# Patient Record
Sex: Female | Born: 1950 | Race: White | Hispanic: No | Marital: Married
Health system: Southern US, Community
[De-identification: ages and names within clinical notes are randomized; demographics above are authoritative.]

## PROBLEM LIST (undated history)

## (undated) DIAGNOSIS — Z9911 Dependence on respirator [ventilator] status: Secondary | ICD-10-CM

## (undated) DIAGNOSIS — R109 Unspecified abdominal pain: Secondary | ICD-10-CM

## (undated) DIAGNOSIS — I469 Cardiac arrest, cause unspecified: Secondary | ICD-10-CM

## (undated) DIAGNOSIS — J449 Chronic obstructive pulmonary disease, unspecified: Secondary | ICD-10-CM

## (undated) DIAGNOSIS — J9621 Acute and chronic respiratory failure with hypoxia: Secondary | ICD-10-CM

## (undated) DIAGNOSIS — J4489 Other specified chronic obstructive pulmonary disease: Secondary | ICD-10-CM

## (undated) DIAGNOSIS — Z978 Presence of other specified devices: Secondary | ICD-10-CM

---

## 2018-12-30 ENCOUNTER — Inpatient Hospital Stay
Admission: RE | Admit: 2018-12-30 | Discharge: 2019-01-27 | Disposition: A | Discharge status: Skilled Nursing Facility | Payer: Medicare Other | Source: Other Acute Inpatient Hospital | Attending: Internal Medicine | Admitting: Internal Medicine

## 2018-12-30 ENCOUNTER — Other Ambulatory Visit (HOSPITAL_COMMUNITY): Payer: Medicare Other

## 2018-12-30 DIAGNOSIS — Z4659 Encounter for fitting and adjustment of other gastrointestinal appliance and device: Secondary | ICD-10-CM

## 2018-12-30 DIAGNOSIS — J9621 Acute and chronic respiratory failure with hypoxia: Secondary | ICD-10-CM | POA: Diagnosis present

## 2018-12-30 DIAGNOSIS — J189 Pneumonia, unspecified organism: Secondary | ICD-10-CM

## 2018-12-30 DIAGNOSIS — Z9289 Personal history of other medical treatment: Secondary | ICD-10-CM

## 2018-12-30 DIAGNOSIS — K838 Other specified diseases of biliary tract: Secondary | ICD-10-CM

## 2018-12-30 DIAGNOSIS — K567 Ileus, unspecified: Secondary | ICD-10-CM

## 2018-12-30 DIAGNOSIS — J4489 Other specified chronic obstructive pulmonary disease: Secondary | ICD-10-CM | POA: Diagnosis present

## 2018-12-30 DIAGNOSIS — R000 Tachycardia, unspecified: Secondary | ICD-10-CM

## 2018-12-30 DIAGNOSIS — Z9911 Dependence on respirator [ventilator] status: Secondary | ICD-10-CM

## 2018-12-30 DIAGNOSIS — Z978 Presence of other specified devices: Secondary | ICD-10-CM | POA: Diagnosis present

## 2018-12-30 DIAGNOSIS — R109 Unspecified abdominal pain: Secondary | ICD-10-CM | POA: Diagnosis present

## 2018-12-30 DIAGNOSIS — J449 Chronic obstructive pulmonary disease, unspecified: Secondary | ICD-10-CM | POA: Diagnosis present

## 2018-12-30 DIAGNOSIS — J9690 Respiratory failure, unspecified, unspecified whether with hypoxia or hypercapnia: Secondary | ICD-10-CM

## 2018-12-30 DIAGNOSIS — I469 Cardiac arrest, cause unspecified: Secondary | ICD-10-CM | POA: Diagnosis present

## 2018-12-30 DIAGNOSIS — R140 Abdominal distension (gaseous): Secondary | ICD-10-CM

## 2018-12-30 HISTORY — DX: Dependence on respirator (ventilator) status: Z99.11

## 2018-12-30 HISTORY — DX: Cardiac arrest, cause unspecified: I46.9

## 2018-12-30 HISTORY — DX: Presence of other specified devices: Z97.8

## 2018-12-30 HISTORY — DX: Other specified chronic obstructive pulmonary disease: J44.89

## 2018-12-30 HISTORY — DX: Unspecified abdominal pain: R10.9

## 2018-12-30 HISTORY — DX: Chronic obstructive pulmonary disease, unspecified: J44.9

## 2018-12-30 HISTORY — DX: Acute and chronic respiratory failure with hypoxia: J96.21

## 2018-12-30 MED ORDER — INSULIN GLARGINE 100 UNIT/ML ~~LOC~~ SOLN
1.00 | SUBCUTANEOUS | Status: DC
Start: ? — End: 2018-12-30

## 2018-12-30 MED ORDER — INSULIN LISPRO 100 UNIT/ML ~~LOC~~ SOLN
1.00 | SUBCUTANEOUS | Status: DC
Start: ? — End: 2018-12-30

## 2018-12-30 MED ORDER — METHYLPREDNISOLONE SODIUM SUCC 40 MG IJ SOLR
40.00 | INTRAMUSCULAR | Status: DC
Start: 2018-12-31 — End: 2018-12-30

## 2018-12-30 MED ORDER — GENERIC EXTERNAL MEDICATION
0.08 | Status: DC
Start: ? — End: 2018-12-30

## 2018-12-30 MED ORDER — GENERIC EXTERNAL MEDICATION
0.04 | Status: DC
Start: ? — End: 2018-12-30

## 2018-12-30 MED ORDER — GENERIC EXTERNAL MEDICATION
Status: DC
Start: ? — End: 2018-12-30

## 2018-12-30 MED ORDER — NITROGLYCERIN 0.4 MG SL SUBL
0.40 | SUBLINGUAL_TABLET | SUBLINGUAL | Status: DC
Start: ? — End: 2018-12-30

## 2018-12-30 MED ORDER — PROPOFOL 200 MG/20ML IV EMUL
5.00 | INTRAVENOUS | Status: DC
Start: ? — End: 2018-12-30

## 2018-12-30 MED ORDER — PANCRELIPASE (LIP-PROT-AMYL) 12000-38000 UNITS PO CPEP
24000.00 | ORAL_CAPSULE | ORAL | Status: DC
Start: 2018-12-31 — End: 2018-12-30

## 2018-12-30 MED ORDER — BUDESONIDE 0.5 MG/2ML IN SUSP
0.50 | RESPIRATORY_TRACT | Status: DC
Start: 2018-12-30 — End: 2018-12-30

## 2018-12-30 MED ORDER — FENTANYL CITRATE-NACL 2.5-0.9 MG/250ML-% IV SOLN
50.00 | INTRAVENOUS | Status: DC
Start: ? — End: 2018-12-30

## 2018-12-30 MED ORDER — MUPIROCIN 2 % EX OINT
TOPICAL_OINTMENT | CUTANEOUS | Status: DC
Start: 2018-12-30 — End: 2018-12-30

## 2018-12-30 MED ORDER — INSULIN GLARGINE 100 UNIT/ML ~~LOC~~ SOLN
1.00 | SUBCUTANEOUS | Status: DC
Start: 2018-12-30 — End: 2018-12-30

## 2018-12-30 MED ORDER — ALUM & MAG HYDROXIDE-SIMETH 200-200-20 MG/5ML PO SUSP
30.00 | ORAL | Status: DC
Start: ? — End: 2018-12-30

## 2018-12-30 MED ORDER — IPRATROPIUM-ALBUTEROL 0.5-2.5 (3) MG/3ML IN SOLN
3.00 | RESPIRATORY_TRACT | Status: DC
Start: 2018-12-30 — End: 2018-12-30

## 2018-12-30 MED ORDER — HYDRALAZINE HCL 20 MG/ML IJ SOLN
20.00 | INTRAMUSCULAR | Status: DC
Start: ? — End: 2018-12-30

## 2018-12-30 MED ORDER — VERAPAMIL HCL 80 MG PO TABS
40.00 | ORAL_TABLET | ORAL | Status: DC
Start: 2018-12-30 — End: 2018-12-30

## 2018-12-30 MED ORDER — ACETAMINOPHEN 325 MG PO TABS
650.00 | ORAL_TABLET | ORAL | Status: DC
Start: ? — End: 2018-12-30

## 2018-12-30 MED ORDER — ACETAMINOPHEN 650 MG RE SUPP
650.00 | RECTAL | Status: DC
Start: ? — End: 2018-12-30

## 2018-12-30 MED ORDER — ENOXAPARIN SODIUM 40 MG/0.4ML ~~LOC~~ SOLN
40.00 | SUBCUTANEOUS | Status: DC
Start: 2018-12-30 — End: 2018-12-30

## 2018-12-30 MED ORDER — INSULIN LISPRO 100 UNIT/ML ~~LOC~~ SOLN
1.00 | SUBCUTANEOUS | Status: DC
Start: 2018-12-30 — End: 2018-12-30

## 2018-12-30 MED ORDER — FAMOTIDINE 20 MG PO TABS
20.00 | ORAL_TABLET | ORAL | Status: DC
Start: 2018-12-30 — End: 2018-12-30

## 2018-12-30 MED ORDER — ARFORMOTEROL TARTRATE 15 MCG/2ML IN NEBU
15.00 | INHALATION_SOLUTION | RESPIRATORY_TRACT | Status: DC
Start: 2018-12-30 — End: 2018-12-30

## 2018-12-30 MED ORDER — ACETAMINOPHEN 160 MG/5ML PO SUSP
650.00 | ORAL | Status: DC
Start: ? — End: 2018-12-30

## 2018-12-30 MED ORDER — LABETALOL HCL 5 MG/ML IV SOLN
20.00 | INTRAVENOUS | Status: DC
Start: ? — End: 2018-12-30

## 2018-12-30 MED ORDER — DOCUSATE SODIUM 150 MG/15ML PO LIQD
100.00 | ORAL | Status: DC
Start: 2018-12-30 — End: 2018-12-30

## 2018-12-30 MED ORDER — ALBUTEROL SULFATE (2.5 MG/3ML) 0.083% IN NEBU
2.50 | INHALATION_SOLUTION | RESPIRATORY_TRACT | Status: DC
Start: ? — End: 2018-12-30

## 2018-12-30 MED ORDER — METOPROLOL TARTRATE 50 MG PO TABS
50.00 | ORAL_TABLET | ORAL | Status: DC
Start: 2018-12-30 — End: 2018-12-30

## 2018-12-30 MED ORDER — LORAZEPAM 1 MG PO TABS
1.00 | ORAL_TABLET | ORAL | Status: DC
Start: 2018-12-31 — End: 2018-12-30

## 2018-12-31 ENCOUNTER — Other Ambulatory Visit (HOSPITAL_COMMUNITY): Payer: Medicare Other

## 2018-12-31 ENCOUNTER — Encounter: Payer: Self-pay | Admitting: Internal Medicine

## 2018-12-31 DIAGNOSIS — J4489 Other specified chronic obstructive pulmonary disease: Secondary | ICD-10-CM | POA: Diagnosis present

## 2018-12-31 DIAGNOSIS — J9621 Acute and chronic respiratory failure with hypoxia: Secondary | ICD-10-CM | POA: Diagnosis not present

## 2018-12-31 DIAGNOSIS — Z978 Presence of other specified devices: Secondary | ICD-10-CM | POA: Diagnosis not present

## 2018-12-31 DIAGNOSIS — Z9911 Dependence on respirator [ventilator] status: Secondary | ICD-10-CM

## 2018-12-31 DIAGNOSIS — I469 Cardiac arrest, cause unspecified: Secondary | ICD-10-CM

## 2018-12-31 DIAGNOSIS — R109 Unspecified abdominal pain: Secondary | ICD-10-CM | POA: Diagnosis not present

## 2018-12-31 DIAGNOSIS — J449 Chronic obstructive pulmonary disease, unspecified: Secondary | ICD-10-CM

## 2018-12-31 LAB — URINALYSIS, ROUTINE W REFLEX MICROSCOPIC
Bilirubin Urine: NEGATIVE
Glucose, UA: >=500 mg/dL — AB
Ketones, ur: NEGATIVE mg/dL
Leukocytes,Ua: NEGATIVE
Nitrite: NEGATIVE
Protein, ur: 100 mg/dL — AB
Specific Gravity, Urine: 1.017 (ref 1.005–1.030)
pH: 5 (ref 5.0–8.0)

## 2018-12-31 LAB — BLOOD GAS, ARTERIAL
Acid-Base Excess: 5.8 mmol/L — ABNORMAL HIGH (ref 0.0–2.0)
Bicarbonate: 30 mmol/L — ABNORMAL HIGH (ref 20.0–28.0)
FIO2: 30
MECHVT: 410 mL
O2 Saturation: 89.6 %
PEEP: 5 cmH2O
Patient temperature: 98.6
RATE: 18 resp/min
pCO2 arterial: 45.3 mmHg (ref 32.0–48.0)
pH, Arterial: 7.436 (ref 7.350–7.450)
pO2, Arterial: 63.9 mmHg — ABNORMAL LOW (ref 83.0–108.0)

## 2018-12-31 LAB — CBC WITH DIFFERENTIAL/PLATELET
Abs Immature Granulocytes: 2.49 10*3/uL — ABNORMAL HIGH (ref 0.00–0.07)
Basophils Absolute: 0.2 10*3/uL — ABNORMAL HIGH (ref 0.0–0.1)
Basophils Relative: 1 %
Eosinophils Absolute: 0 10*3/uL (ref 0.0–0.5)
Eosinophils Relative: 0 %
HCT: 37.6 % (ref 36.0–46.0)
Hemoglobin: 11.8 g/dL — ABNORMAL LOW (ref 12.0–15.0)
Immature Granulocytes: 9 %
Lymphocytes Relative: 6 %
Lymphs Abs: 1.6 10*3/uL (ref 0.7–4.0)
MCH: 26.8 pg (ref 26.0–34.0)
MCHC: 31.4 g/dL (ref 30.0–36.0)
MCV: 85.5 fL (ref 80.0–100.0)
Monocytes Absolute: 1.1 10*3/uL — ABNORMAL HIGH (ref 0.1–1.0)
Monocytes Relative: 4 %
Neutro Abs: 23.2 10*3/uL — ABNORMAL HIGH (ref 1.7–7.7)
Neutrophils Relative %: 80 %
Platelets: 448 10*3/uL — ABNORMAL HIGH (ref 150–400)
RBC: 4.4 MIL/uL (ref 3.87–5.11)
RDW: 16.2 % — ABNORMAL HIGH (ref 11.5–15.5)
WBC: 28.5 10*3/uL — ABNORMAL HIGH (ref 4.0–10.5)
nRBC: 0.6 % — ABNORMAL HIGH (ref 0.0–0.2)

## 2018-12-31 LAB — BASIC METABOLIC PANEL
Anion gap: 12 (ref 5–15)
BUN: 48 mg/dL — ABNORMAL HIGH (ref 8–23)
CO2: 27 mmol/L (ref 22–32)
Calcium: 9.4 mg/dL (ref 8.9–10.3)
Chloride: 101 mmol/L (ref 98–111)
Creatinine, Ser: 0.86 mg/dL (ref 0.44–1.00)
GFR calc Af Amer: >60 mL/min (ref >60)
GFR calc non Af Amer: >60 mL/min (ref >60)
Glucose, Bld: 180 mg/dL — ABNORMAL HIGH (ref 70–99)
Potassium: 4.6 mmol/L (ref 3.5–5.1)
Sodium: 140 mmol/L (ref 135–145)

## 2018-12-31 LAB — TRIGLYCERIDES: Triglycerides: 337 mg/dL — ABNORMAL HIGH (ref <150)

## 2018-12-31 NOTE — Consult Note (Signed)
Pulmonary [facility]  Date of Service: 12/31/2018  PULMONARY CRITICAL CARE CONSULT   Bianca Willis  FGH:[id number]  DOB: [birth year]   DOA: 12/30/2018  Referring Physician: Merton Border, MD  HPI: Bianca Willis is a 68 y.o. female seen for follow up of Acute on Chronic Respiratory Failure.  Patient has multiple medical problems including type 2 diabetes COPD GERD hypertension came into the emergency department at the transferring facility because of diarrhea.  Patient did apparently had a cardiac arrest was emergently intubated.  Patient had undergone a colonoscopy as an outpatient.  Blood work was done unremarkable abdominal evaluation was done which was unremarkable.  CT of the abdomen and pelvis has been ordered and while in the CT scanner apparently patient became cyanotic and indicated that she was having difficulty breathing.  Patient subsequently became unresponsive and pulseless.  CPR was done patient underwent ACLS protocol with return of circulation.  After 2 doses of epinephrine she had still been noted to be pulseless.  Patient was subsequently found to have pulseless ventricular tachycardia and required defibrillation..  Follow-up patient was transferred to the intensive care unit for further management.  At the time of evaluation here she has an endotracheal tube in place and she is on assist control mode she is currently is also on fentanyl and propofol for sedation  Review of Systems:  ROS performed and is unremarkable other than noted above.  Past Medical History:  Diagnosis Date  ?Marland Kitchen Anemia  ?Marland Kitchen Anxiety  ?Marland Kitchen Arthritis  ?Marland Kitchen Asthma  ?. CHF (congestive heart failure) (*)  ?Marland Kitchen COPD (chronic obstructive pulmonary disease) (*)  ?. Diabetes mellitus (*)  ?Marland Kitchen GERD (gastroesophageal reflux disease)  ?Marland Kitchen Goiter  ?Marland Kitchen Hypertension  ?. Kidney mass  ?Marland Kitchen Lung disease  ?Marland Kitchen Osteoporosis  ?. SOB (shortness of breath) on  exertion   Past Surgical History:  Procedure Laterality Date  ?Marland Kitchen Appendectomy  12-13 years ago  ?Marland Kitchen Breast biopsy Right 07/07/1998  B-9  ?Marland Kitchen Cholecystectomy  years ago  ?. Dilation and curettage of uterus  years ago  ?Marland Kitchen Eye surgery 2017  ?Marland Kitchen Hysterectomy  age 41  ?Marland Kitchen Sinus surgery Bilateral  20 years ago  ?Marland Kitchen Tonsillectomy  age 58   Allergies  Allergen Reactions  ?Marland Kitchen Adhesive [Contact Dermatitis] Redness  ?Marland Kitchen Ceftriaxone Unknown  **Tolerated dose of Zosyn 05/26/2018, also tolerated multiple doses of cefepime during 07/27/18 admission with no issues noted.  ?. Clarithromycin Other  Makes tongue black 3Provider: Hal Hope CFM - Allergy Description: Biaxin *MACROLIDES* CFM - Allergy Annotation:  ?. Levofloxacin Other  Numbness  ?Marland Kitchen Lipitor [Atorvastatin] Other  Myalgia  ?Marland Kitchen Macrobid [Nitrofurantoin] Dizziness  Patient states that this caused her to be dizzy and she was to stop taking it and did not.  ?. Rofecoxib Unknown  Provider: Hal Hope CFM - Allergy Description: Vioxx *ANALGESICS - ANTI-INFLAMMATORY* CFM - Allergy Annotation:    Medications: Reviewed on Rounds  Physical Exam:  Vitals: Temperature 98.4 pulse 110 respiratory rate 18 blood pressure was 196/94 saturations 97%  Ventilator Settings mode ventilation assist control FiO2 30% tidal volume 420 PEEP 5  . General: Comfortable at this time . Eyes: Grossly normal lids, irises & conjunctiva . ENT: grossly tongue is normal . Neck: no obvious mass . Cardiovascular: S1-S2 normal no gallop or rub is noted at this time . Respiratory: Coarse breath sounds with few rhonchi . Abdomen: Soft and nontender . Skin: no rash seen on  limited exam . Musculoskeletal: not rigid . Psychiatric:unable to assess . Neurologic: no seizure no involuntary movements         Labs on Admission:  Basic Metabolic Panel: Recent Labs  Lab 12/31/18 0756  NA 140  K 4.6  CL 101  CO2 27  GLUCOSE 180*  BUN 48*  CREATININE 0.86   CALCIUM 9.4    Recent Labs  Lab 12/31/18 0515  PHART 7.436  PCO2ART 45.3  PO2ART 63.9*  HCO3 30.0*  O2SAT 89.6    Liver Function Tests: No results for input(s): AST, ALT, ALKPHOS, BILITOT, PROT, ALBUMIN in the last 168 hours. No results for input(s): LIPASE, AMYLASE in the last 168 hours. No results for input(s): AMMONIA in the last 168 hours.  CBC: Recent Labs  Lab 12/31/18 0756  WBC 28.5*  NEUTROABS 23.2*  HGB 11.8*  HCT 37.6  MCV 85.5  PLT 448*    Cardiac Enzymes: No results for input(s): CKTOTAL, CKMB, CKMBINDEX, TROPONINI in the last 168 hours.  BNP (last 3 results) No results for input(s): BNP in the last 8760 hours.  ProBNP (last 3 results) No results for input(s): PROBNP in the last 8760 hours.   Radiological Exams on Admission: Dg Chest Port 1 View  Result Date: 12/31/2018 CLINICAL DATA:  Respiratory failure EXAM: PORTABLE CHEST 1 VIEW COMPARISON:  Portable exam 0726 hours compared to 12/30/2018 FINDINGS: Tip of endotracheal tube projects 4.5 cm above carina. Nasogastric tube coiled in stomach. LEFT jugular line with tip projecting over SVC. Rotated to the RIGHT. Normal heart size, mediastinal contours, and pulmonary vascularity. Lungs grossly clear. No pleural effusion or pneumothorax. Bones demineralized. IMPRESSION: No acute abnormalities. Electronically Signed   By: Ulyses SouthwardMark  Boles M.D.   On: 12/31/2018 08:01   Dg Chest Port 1 View  Result Date: 12/30/2018 CLINICAL DATA:  NG tube placement EXAM: PORTABLE CHEST 1 VIEW COMPARISON:  None. FINDINGS: Endotracheal tube tip is about 4.3 cm superior to the carina. Left IJ central venous catheter tip over the SVC. Esophageal tube tip below the diaphragm but non included. Rotated patient. Patchy basilar airspace opacities. Enlarged cardiomediastinal silhouette augmented by rotation. No pneumothorax. Old right-sided rib fractures. IMPRESSION: 1. Endotracheal tube tip about 4.3 cm superior to carina. Esophageal tube tip  below the diaphragm but non included 2. Patchy and hazy atelectasis or mild infiltrates at the bases Electronically Signed   By: Jasmine PangKim  Fujinaga M.D.   On: 12/30/2018 22:34   Dg Abd Portable 1v  Result Date: 12/31/2018 CLINICAL DATA:  NG tube placement EXAM: PORTABLE ABDOMEN - 1 VIEW COMPARISON:  None. FINDINGS: The NG tube tip projects over the gastric body/antrum. The bowel gas pattern is nonspecific with some gaseous distention of multiple loops of small bowel and colon scattered throughout the abdomen. There is a moderate amount of stool throughout the colon. IMPRESSION: NG tube tip projects over the gastric body/antrum. Electronically Signed   By: Katherine Mantlehristopher  Green M.D.   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 Acute on chronic respiratory failure with hypoxia we will continue with full vent support at this time patient is sedated on fentanyl as well as propofol.  We are going to need to back off on the sedation to assess if patient has a good  weaning parameters and then begin the weaning.  Right now she is orally intubated with a high risk airway 2. Cardiac arrest right now the rhythm is stable patient did have witnessed arrest but unknown how she will do once we back off on the sedation. 3. COPD by history nebulizer therapy as tolerated we will continue with supportive care.  Patient has been on steroids continue with nebulizers as indicated above 4. Abdominal pain original cause for admission right now the current abdominal and chest x-ray shows unremarkable findings in the abdomen we will continue to monitor.  I have personally seen and evaluated the patient, evaluated laboratory and imaging results, formulated the assessment and plan and  placed orders.  Patient is critically ill in danger of cardiac arrest high risk The Patient requires high complexity decision making for assessment and support.  Case was discussed on Rounds with the Respiratory Therapy Staff Time Spent 70minutes  Yevonne PaxSaadat A Khan, MD [facility] Pulmonary Critical Care Medicine Sleep Medicine

## 2019-01-01 ENCOUNTER — Other Ambulatory Visit (HOSPITAL_COMMUNITY): Payer: Medicare Other

## 2019-01-01 DIAGNOSIS — Z9289 Personal history of other medical treatment: Secondary | ICD-10-CM | POA: Diagnosis not present

## 2019-01-01 DIAGNOSIS — J449 Chronic obstructive pulmonary disease, unspecified: Secondary | ICD-10-CM | POA: Diagnosis not present

## 2019-01-01 DIAGNOSIS — I469 Cardiac arrest, cause unspecified: Secondary | ICD-10-CM | POA: Diagnosis not present

## 2019-01-01 DIAGNOSIS — J9621 Acute and chronic respiratory failure with hypoxia: Secondary | ICD-10-CM | POA: Diagnosis not present

## 2019-01-01 LAB — URINE CULTURE: Culture: <10000 — AB

## 2019-01-01 LAB — T4, FREE: Free T4: 0.83 ng/dL (ref 0.61–1.12)

## 2019-01-01 LAB — BASIC METABOLIC PANEL
Anion gap: 10 (ref 5–15)
BUN: 47 mg/dL — ABNORMAL HIGH (ref 8–23)
CO2: 31 mmol/L (ref 22–32)
Calcium: 8.9 mg/dL (ref 8.9–10.3)
Chloride: 102 mmol/L (ref 98–111)
Creatinine, Ser: 0.78 mg/dL (ref 0.44–1.00)
GFR calc Af Amer: >60 mL/min (ref >60)
GFR calc non Af Amer: >60 mL/min (ref >60)
Glucose, Bld: 251 mg/dL — ABNORMAL HIGH (ref 70–99)
Potassium: 4.3 mmol/L (ref 3.5–5.1)
Sodium: 143 mmol/L (ref 135–145)

## 2019-01-01 LAB — CBC WITH DIFFERENTIAL/PLATELET
Abs Immature Granulocytes: 0.95 10*3/uL — ABNORMAL HIGH (ref 0.00–0.07)
Basophils Absolute: 0.1 10*3/uL (ref 0.0–0.1)
Basophils Relative: 0 %
Eosinophils Absolute: 0 10*3/uL (ref 0.0–0.5)
Eosinophils Relative: 0 %
HCT: 33.7 % — ABNORMAL LOW (ref 36.0–46.0)
Hemoglobin: 10.6 g/dL — ABNORMAL LOW (ref 12.0–15.0)
Immature Granulocytes: 4 %
Lymphocytes Relative: 7 %
Lymphs Abs: 1.5 10*3/uL (ref 0.7–4.0)
MCH: 27.3 pg (ref 26.0–34.0)
MCHC: 31.5 g/dL (ref 30.0–36.0)
MCV: 86.9 fL (ref 80.0–100.0)
Monocytes Absolute: 1.4 10*3/uL — ABNORMAL HIGH (ref 0.1–1.0)
Monocytes Relative: 6 %
Neutro Abs: 18.8 10*3/uL — ABNORMAL HIGH (ref 1.7–7.7)
Neutrophils Relative %: 83 %
Platelets: 409 10*3/uL — ABNORMAL HIGH (ref 150–400)
RBC: 3.88 MIL/uL (ref 3.87–5.11)
RDW: 16.2 % — ABNORMAL HIGH (ref 11.5–15.5)
WBC: 22.7 10*3/uL — ABNORMAL HIGH (ref 4.0–10.5)
nRBC: 0.2 % (ref 0.0–0.2)

## 2019-01-01 LAB — BLOOD GAS, ARTERIAL
Acid-Base Excess: 7.5 mmol/L — ABNORMAL HIGH (ref 0.0–2.0)
Bicarbonate: 32.1 mmol/L — ABNORMAL HIGH (ref 20.0–28.0)
FIO2: 0.35
MECHVT: 410 mL
O2 Saturation: 89.7 %
PEEP: 5 cmH2O
Patient temperature: 98.6
RATE: 18 resp/min
pCO2 arterial: 50.6 mmHg — ABNORMAL HIGH (ref 32.0–48.0)
pH, Arterial: 7.418 (ref 7.350–7.450)
pO2, Arterial: 66.6 mmHg — ABNORMAL LOW (ref 83.0–108.0)

## 2019-01-01 LAB — COMPREHENSIVE METABOLIC PANEL
ALT: 42 U/L (ref 0–44)
AST: 29 U/L (ref 15–41)
Albumin: 2.4 g/dL — ABNORMAL LOW (ref 3.5–5.0)
Alkaline Phosphatase: 87 U/L (ref 38–126)
Anion gap: 9 (ref 5–15)
BUN: 49 mg/dL — ABNORMAL HIGH (ref 8–23)
CO2: 27 mmol/L (ref 22–32)
Calcium: 9.1 mg/dL (ref 8.9–10.3)
Chloride: 105 mmol/L (ref 98–111)
Creatinine, Ser: 0.83 mg/dL (ref 0.44–1.00)
GFR calc Af Amer: >60 mL/min (ref >60)
GFR calc non Af Amer: >60 mL/min (ref >60)
Glucose, Bld: 250 mg/dL — ABNORMAL HIGH (ref 70–99)
Potassium: 4.2 mmol/L (ref 3.5–5.1)
Sodium: 141 mmol/L (ref 135–145)
Total Bilirubin: 0.3 mg/dL (ref 0.3–1.2)
Total Protein: 5.1 g/dL — ABNORMAL LOW (ref 6.5–8.1)

## 2019-01-01 LAB — TSH: TSH: 4.369 u[IU]/mL (ref 0.350–4.500)

## 2019-01-01 LAB — HEMOGLOBIN A1C
Hgb A1c MFr Bld: 7 % — ABNORMAL HIGH (ref 4.8–5.6)
Mean Plasma Glucose: 154.2 mg/dL

## 2019-01-01 LAB — MAGNESIUM
Magnesium: 1.9 mg/dL (ref 1.7–2.4)
Magnesium: 1.7 mg/dL (ref 1.7–2.4)

## 2019-01-01 LAB — PHOSPHORUS: Phosphorus: 4.2 mg/dL (ref 2.5–4.6)

## 2019-01-01 MED ORDER — GENERIC EXTERNAL MEDICATION
Status: DC
Start: ? — End: 2019-01-01

## 2019-01-01 NOTE — Progress Notes (Addendum)
[facility]   PULMONARY CRITICAL CARE SERVICE  PROGRESS NOTE  Date of Service: 01/01/2019  [name]  ZOX:[id number]  DOB: 02/23/1951   DOA: 12/30/2018  Referring Physician: Carron CurieAli Hijazi, MD  HPI: Bianca PieriniJudy Galloway Willis is a 68 y.o. female seen for follow up of Acute on Chronic Respiratory Failure.  Patient remains on pressure support 12/5 FiO2 of 35% for goal of 8 hours today.  Currently doing well no distress.    Medications: Reviewed on Rounds  Physical Exam:  Vitals: Pulse 81 respirations 18 BP 175/76 O2 sat 97% temp 97.4  Ventilator Settings pressure support 12/5 FiO2 35%  . General: Comfortable at this time . Eyes: Grossly normal lids, irises & conjunctiva . ENT: grossly tongue is normal . Neck: no obvious mass . Cardiovascular: S1 S2 normal no gallop . Respiratory: Coarse breath sounds . Abdomen: soft . Skin: no rash seen on limited exam . Musculoskeletal: not rigid . Psychiatric:unable to assess . Neurologic: no seizure no involuntary movements         Lab Data:   Basic Metabolic Panel: Recent Labs  Lab 12/31/18 0756 01/01/19 0807  NA 140 141  K 4.6 4.2  CL 101 105  CO2 27 27  GLUCOSE 180* 250*  BUN 48* 49*  CREATININE 0.86 0.83  CALCIUM 9.4 9.1  MG  --  1.9  PHOS  --  4.2    ABG: Recent Labs  Lab 12/31/18 0515 01/01/19 0422  PHART 7.436 7.418  PCO2ART 45.3 50.6*  PO2ART 63.9* 66.6*  HCO3 30.0* 32.1*  O2SAT 89.6 89.7    Liver Function Tests: Recent Labs  Lab 01/01/19 0807  AST 29  ALT 42  ALKPHOS 87  BILITOT 0.3  PROT 5.1*  ALBUMIN 2.4*   No results for input(s): LIPASE, AMYLASE in the last 168 hours. No results for input(s): AMMONIA in the last 168 hours.  CBC: Recent Labs  Lab 12/31/18 0756 01/01/19 0807  WBC 28.5* 22.7*  NEUTROABS 23.2* 18.8*  HGB 11.8* 10.6*  HCT 37.6 33.7*  MCV 85.5 86.9  PLT 448* 409*    Cardiac Enzymes: No results for input(s):  CKTOTAL, CKMB, CKMBINDEX, TROPONINI in the last 168 hours.  BNP (last 3 results) No results for input(s): BNP in the last 8760 hours.  ProBNP (last 3 results) No results for input(s): PROBNP in the last 8760 hours.  Radiological Exams: Dg Abd 1 View  Result Date: 01/01/2019 CLINICAL DATA:  68 year old female with a history of abdominal distension EXAM: ABDOMEN - 1 VIEW COMPARISON:  12/31/2018 FINDINGS: Gastric tube terminates in the stomach with decompression of the stomach. Persistent small bowel and colonic distention, with regions of the abdomen not imaged given the patient's body habitus. Degree of distention has subjectively increased compared to the prior. No radiopaque foreign body.  No gas projecting over the liver. Cholecystectomy. IMPRESSION: Persisting dilation of small bowel and colon, which appears progressed from the prior. Bowel obstruction/colonic obstruction not excluded and further evaluation with CT imaging may be useful. Gastric tube unchanged with decompression of the stomach. Cholecystectomy Electronically Signed   By: Gilmer MorJaime  Wagner D.O.   On: 01/01/2019 12:56   Dg Chest Port 1 View  Result Date: 01/01/2019 CLINICAL DATA:  Respiratory failure EXAM: PORTABLE CHEST 1 VIEW COMPARISON:  Yesterday FINDINGS: Endotracheal tube tip just below the clavicular heads. Left IJ line and orogastric tube in stable position. Indistinct opacities at the lung bases. No Kerley lines or pneumothorax. Gas  distended bowel over the upper abdomen. Normal heart size. IMPRESSION: 1. Unremarkable hardware positioning. 2. Mild opacity at the bases favoring atelectasis. 3. Gaseous distension of bowel in the upper abdomen. Electronically Signed   By: Monte Fantasia M.D.   On: 01/01/2019 08:23   Dg Chest Port 1 View  Result Date: 12/31/2018 CLINICAL DATA:  Respiratory failure EXAM: PORTABLE CHEST 1 VIEW COMPARISON:  Portable exam 0726 hours compared to 12/30/2018 FINDINGS: Tip of endotracheal tube projects  4.5 cm above carina. Nasogastric tube coiled in stomach. LEFT jugular line with tip projecting over SVC. Rotated to the RIGHT. Normal heart size, mediastinal contours, and pulmonary vascularity. Lungs grossly clear. No pleural effusion or pneumothorax. Bones demineralized. IMPRESSION: No acute abnormalities. Electronically Signed   By: Lavonia Dana M.D.   On: 12/31/2018 08:01   Dg Chest Port 1 View  Result Date: 12/30/2018 CLINICAL DATA:  NG tube placement EXAM: PORTABLE CHEST 1 VIEW COMPARISON:  None. FINDINGS: Endotracheal tube tip is about 4.3 cm superior to the carina. Left IJ central venous catheter tip over the SVC. Esophageal tube tip below the diaphragm but non included. Rotated patient. Patchy basilar airspace opacities. Enlarged cardiomediastinal silhouette augmented by rotation. No pneumothorax. Old right-sided rib fractures. IMPRESSION: 1. Endotracheal tube tip about 4.3 cm superior to carina. Esophageal tube tip below the diaphragm but non included 2. Patchy and hazy atelectasis or mild infiltrates at the bases Electronically Signed   By: Donavan Foil M.D.   On: 12/30/2018 22:34   Dg Abd Portable 1v  Result Date: 12/31/2018 CLINICAL DATA:  NG tube placement EXAM: PORTABLE ABDOMEN - 1 VIEW COMPARISON:  None. FINDINGS: The NG tube tip projects over the gastric body/antrum. The bowel gas pattern is nonspecific with some gaseous distention of multiple loops of small bowel and colon scattered throughout the abdomen. There is a moderate amount of stool throughout the colon. IMPRESSION: NG tube tip projects over the gastric body/antrum. Electronically Signed   By: Constance Holster M.D.   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 Acute on chronic  respiratory failure with hypoxia continue with ventilator support at this time.  Patient remains on propofol and fentanyl.  Continue pressure support trial today for goal of 8 hours on 35% 12/5.  Continue supportive measures and pulmonary toilet 2. Cardiac arrest rhythm is stable continue to monitor 3. COPD history, continue current medication supportive measures. 4. Abdominal pain, continue to monitor at this time.   I have personally seen and evaluated the patient, evaluated laboratory and imaging results, formulated the assessment and plan and placed orders. The Patient requires high complexity decision making for assessment and support.  Case was discussed on Rounds with the Respiratory Therapy Staff  Allyne Gee, MD [facility] Pulmonary Critical Care Medicine Sleep Medicine

## 2019-01-02 ENCOUNTER — Encounter: Payer: Self-pay | Admitting: Radiology

## 2019-01-02 ENCOUNTER — Other Ambulatory Visit (HOSPITAL_COMMUNITY): Payer: Medicare Other

## 2019-01-02 DIAGNOSIS — J449 Chronic obstructive pulmonary disease, unspecified: Secondary | ICD-10-CM | POA: Diagnosis not present

## 2019-01-02 DIAGNOSIS — I469 Cardiac arrest, cause unspecified: Secondary | ICD-10-CM | POA: Diagnosis not present

## 2019-01-02 DIAGNOSIS — Z9289 Personal history of other medical treatment: Secondary | ICD-10-CM | POA: Diagnosis not present

## 2019-01-02 DIAGNOSIS — J9621 Acute and chronic respiratory failure with hypoxia: Secondary | ICD-10-CM | POA: Diagnosis not present

## 2019-01-02 LAB — CBC WITH DIFFERENTIAL/PLATELET
Abs Immature Granulocytes: 0.72 10*3/uL — ABNORMAL HIGH (ref 0.00–0.07)
Basophils Absolute: 0 10*3/uL (ref 0.0–0.1)
Basophils Relative: 0 %
Eosinophils Absolute: 0 10*3/uL (ref 0.0–0.5)
Eosinophils Relative: 0 %
HCT: 32.9 % — ABNORMAL LOW (ref 36.0–46.0)
Hemoglobin: 10.2 g/dL — ABNORMAL LOW (ref 12.0–15.0)
Immature Granulocytes: 3 %
Lymphocytes Relative: 6 %
Lymphs Abs: 1.4 10*3/uL (ref 0.7–4.0)
MCH: 26.9 pg (ref 26.0–34.0)
MCHC: 31 g/dL (ref 30.0–36.0)
MCV: 86.8 fL (ref 80.0–100.0)
Monocytes Absolute: 1.5 10*3/uL — ABNORMAL HIGH (ref 0.1–1.0)
Monocytes Relative: 6 %
Neutro Abs: 21.1 10*3/uL — ABNORMAL HIGH (ref 1.7–7.7)
Neutrophils Relative %: 85 %
Platelets: 400 10*3/uL (ref 150–400)
RBC: 3.79 MIL/uL — ABNORMAL LOW (ref 3.87–5.11)
RDW: 16.2 % — ABNORMAL HIGH (ref 11.5–15.5)
WBC: 24.7 10*3/uL — ABNORMAL HIGH (ref 4.0–10.5)
nRBC: 0 % (ref 0.0–0.2)

## 2019-01-02 LAB — C DIFFICILE QUICK SCREEN W PCR REFLEX
C Diff antigen: NEGATIVE
C Diff interpretation: NOT DETECTED
C Diff toxin: NEGATIVE

## 2019-01-02 LAB — MAGNESIUM: Magnesium: 1.9 mg/dL (ref 1.7–2.4)

## 2019-01-02 LAB — OCCULT BLOOD X 1 CARD TO LAB, STOOL: Fecal Occult Bld: POSITIVE — AB

## 2019-01-02 LAB — BASIC METABOLIC PANEL
Anion gap: 10 (ref 5–15)
BUN: 46 mg/dL — ABNORMAL HIGH (ref 8–23)
CO2: 30 mmol/L (ref 22–32)
Calcium: 8.4 mg/dL — ABNORMAL LOW (ref 8.9–10.3)
Chloride: 102 mmol/L (ref 98–111)
Creatinine, Ser: 0.86 mg/dL (ref 0.44–1.00)
GFR calc Af Amer: >60 mL/min (ref >60)
GFR calc non Af Amer: >60 mL/min (ref >60)
Glucose, Bld: 200 mg/dL — ABNORMAL HIGH (ref 70–99)
Potassium: 4.2 mmol/L (ref 3.5–5.1)
Sodium: 142 mmol/L (ref 135–145)

## 2019-01-02 LAB — CULTURE, RESPIRATORY W GRAM STAIN

## 2019-01-02 MED ORDER — GENERIC EXTERNAL MEDICATION
Status: DC
Start: ? — End: 2019-01-02

## 2019-01-02 MED ORDER — IOHEXOL 300 MG/ML  SOLN
100.0000 mL | Freq: Once | INTRAMUSCULAR | Status: AC | PRN
  Administered 2019-01-02: 100 mL via INTRAVENOUS

## 2019-01-02 NOTE — Consult Note (Signed)
[facility] CONSULT Reason for consult: Colonic pseudoobstruction [facility]  Bianca Willis is an 68 y.o. female.  HPI: She is hospitalized here on the ventilator.  Obtain a lot of the history from the chart but had a long conversation with her husband.  The patient had colonoscopy 12/06/2018 [facility] by Dr. Ezzard StandingNewman.  According to the note which I was able to obtain from care anywhere the prep was marginal but no polyps obstructions masses were noted.  The patient had had problems with her bowels primarily constipation prior to the colonoscopy had diarrhea and abdominal pain after the colonoscopy.  According to the husband the patient had been having chronic back pain and following the colonoscopy she began to have abdominal pain.  Approximately 3 weeks and a few days later her son took her to the emergency room.  The reason for this was progressive back pain and worsening abdominal pain.  The husband said she was having diarrhea and was taking some over-the-counter antidiarrheal medicines.  While being evaluated in the emergency room CT scan was ordered and the patient had a cardiopulmonary arrest in radiology and required CPR and was pulseless for some time.  She was intubated on the ventilator [facility].She had a history of COPD.  She has acute metabolic encephalopathy hypertension and severe leukocytosis with WBC markedly elevated.  She has Pseudomonas pneumonia on her suction cultures from the endotracheal tube blood cultures have no growth.  She has been on steroids for her respiratory failure.  She has had the NG tube to low suction due to abdominal distention.  Her labs have revealed essentially normal potassium and magnesium.  She has received an enema and apparently did have a bowel movement spontaneously yesterday it was small.  KUB has shown marked dilation of the colon and CT scan today  showedGrossly distended: Cecum at least 12 cm.  No clear volvulus or obstruction.  There is gas to the mid sigmoid not clear if there is obstruction in this area or not.  Small bowel is not distended.  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  Past Medical History:  Diagnosis Date  . Acute on chronic respiratory failure with hypoxia (HCC)   . Cardiac arrest (HCC)   . Endotracheally intubated   . Nonspecific abdominal pain   . Obstructive chronic bronchitis without exacerbation (HCC)   . Ventilator dependent [facility](HCC)   Past surgical history: Appendectomy Cholecystectomy Hysterectomy    No family history on file.  Social History:  has no history on file for tobacco, alcohol, and drug.  Allergies: Ceftriaxone,Clarithromycin, levofloxacin, Lipitor Macrobid  Medications; Prior to Admission medications   Not on File    PRN Meds  Results for orders placed or performed during the hospital encounter of 12/30/18 (from the past 48 hour(s))  Blood gas, arterial     Status: Abnormal   Collection Time: 01/01/19  4:22 AM  Result Value Ref Range   FIO2 0.35    Delivery systems VENTILATOR    VT 410 mL   LHR 18.0 resp/min   Peep/cpap 5.0 cm H20   pH, Arterial 7.418 7.350 - 7.450   pCO2 arterial 50.6 (H) 32.0 - 48.0 mmHg   pO2, Arterial 66.6 (L) 83.0 - 108.0 mmHg   Bicarbonate 32.1 (H) 20.0 - 28.0 mmol/L   Acid-Base Excess 7.5 (H) 0.0 - 2.0 mmol/L   O2 Saturation 89.7 %   Patient  temperature 98.6    Collection site RIGHT RADIAL    Drawn by COLLECTED BY RT    Sample type ARTERIAL DRAW    Allens test (pass/fail) PASS PASS  CBC with Differential/Platelet     Status: Abnormal   Collection Time: 01/01/19  8:07 AM  Result Value Ref Range   WBC 22.7 (H) 4.0 - 10.5 K/uL   RBC 3.88 3.87 - 5.11 MIL/uL   Hemoglobin 10.6 (L) 12.0 - 15.0 g/dL   HCT 95.633.7 (L) 21.336.0 - 08.646.0 %   MCV 86.9 80.0 - 100.0 fL   MCH 27.3 26.0 - 34.0 pg   MCHC 31.5 30.0 - 36.0 g/dL   RDW 57.816.2 (H)  46.911.5 - 15.5 %   Platelets 409 (H) 150 - 400 K/uL   nRBC 0.2 0.0 - 0.2 %   Neutrophils Relative % 83 %   Neutro Abs 18.8 (H) 1.7 - 7.7 K/uL   Lymphocytes Relative 7 %   Lymphs Abs 1.5 0.7 - 4.0 K/uL   Monocytes Relative 6 %   Monocytes Absolute 1.4 (H) 0.1 - 1.0 K/uL   Eosinophils Relative 0 %   Eosinophils Absolute 0.0 0.0 - 0.5 K/uL   Basophils Relative 0 %   Basophils Absolute 0.1 0.0 - 0.1 K/uL   Immature Granulocytes 4 %   Abs Immature Granulocytes 0.95 (H) 0.00 - 0.07 K/uL    Comment: Performed at [facility], [address] [place], [place] [address]  Comprehensive metabolic panel     Status: Abnormal   Collection Time: 01/01/19  8:07 AM  Result Value Ref Range   Sodium 141 135 - 145 mmol/L   Potassium 4.2 3.5 - 5.1 mmol/L   Chloride 105 98 - 111 mmol/L   CO2 27 22 - 32 mmol/L   Glucose, Bld 250 (H) 70 - 99 mg/dL   BUN 49 (H) 8 - 23 mg/dL   Creatinine, Ser 8.410.83 0.44 - 1.00 mg/dL   Calcium 9.1 8.9 - 32.410.3 mg/dL   Total Protein 5.1 (L) 6.5 - 8.1 g/dL   Albumin 2.4 (L) 3.5 - 5.0 g/dL   AST 29 15 - 41 U/L   ALT 42 0 - 44 U/L   Alkaline Phosphatase 87 38 - 126 U/L   Total Bilirubin 0.3 0.3 - 1.2 mg/dL   GFR calc non Af Amer >60 >60 mL/min   GFR calc Af Amer >60 >60 mL/min   Anion gap 9 5 - 15    Comment: Performed at [facility], [address] [place], [place] [address]  Magnesium     Status: None   Collection Time: 01/01/19  8:07 AM  Result Value Ref Range   Magnesium 1.9 1.7 - 2.4 mg/dL    Comment: Performed at [facility], [address] [place], [place] [address]  Phosphorus     Status: None   Collection Time: 01/01/19  8:07 AM  Result Value Ref Range   Phosphorus 4.2 2.5 - 4.6 mg/dL    Comment: Performed at [facility], [address] [place], [place] [address]  TSH     Status: None   Collection Time: 01/01/19  8:07 AM  Result Value Ref Range   TSH 4.369 0.350 - 4.500 uIU/mL    Comment: Performed by a 3rd Generation assay with a  functional sensitivity of <=0.01 uIU/mL. Performed at [facility], [address] [place], [place] [address]   T4, free  Status: None   Collection Time: 01/01/19  8:07 AM  Result Value Ref Range   Free T4 0.83 0.61 - 1.12 ng/dL    Comment: (NOTE) Biotin ingestion may interfere with free T4 tests. If the results are inconsistent with the TSH level, previous test results, or the clinical presentation, then consider biotin interference. If needed, order repeat testing after stopping biotin. Performed at [facility], [place] [address] [place], [place] [address]   Hemoglobin A1c     Status: Abnormal   Collection Time: 01/01/19  8:07 AM  Result Value Ref Range   Hgb A1c MFr Bld 7.0 (H) 4.8 - 5.6 %    Comment: (NOTE) Pre diabetes:          5.7%-6.4% Diabetes:              >6.4% Glycemic control for   <7.0% adults with diabetes    Mean Plasma Glucose 154.2 mg/dL    Comment: Performed at [place] [address], [place], [place] [address]  Basic metabolic panel     Status: Abnormal   Collection Time: 01/01/19  6:18 PM  Result Value Ref Range   Sodium 143 135 - 145 mmol/L   Potassium 4.3 3.5 - 5.1 mmol/L   Chloride 102 98 - 111 mmol/L   CO2 31 22 - 32 mmol/L   Glucose, Bld 251 (H) 70 - 99 mg/dL   BUN 47 (H) 8 - 23 mg/dL   Creatinine, Ser 0.78 0.44 - 1.00 mg/dL   Calcium 8.9 8.9 - 10.3 mg/dL   GFR calc non Af Amer >60 >60 mL/min   GFR calc Af Amer >60 >60 mL/min   Anion gap 10 5 - 15    Comment: Performed at [facility], [place] [address], [place], [place] [address]  Magnesium     Status: None   Collection Time: 01/01/19  6:18 PM  Result Value Ref Range   Magnesium 1.7 1.7 - 2.4 mg/dL    Comment: Performed at [place] [address], [place], [place] [address]  C difficile quick scan w PCR reflex     Status: None   Collection Time: 01/01/19  9:33 PM   Specimen: STOOL  Result Value Ref Range   C Diff antigen NEGATIVE NEGATIVE   C Diff  toxin NEGATIVE NEGATIVE   C Diff interpretation No C. difficile detected.     Comment: Performed at [facility], [place] [address], [place], [place] [address]  CBC with Differential/Platelet     Status: Abnormal   Collection Time: 01/02/19 10:33 AM  Result Value Ref Range   WBC 24.7 (H) 4.0 - 10.5 K/uL   RBC 3.79 (L) 3.87 - 5.11 MIL/uL   Hemoglobin 10.2 (L) 12.0 - 15.0 g/dL   HCT 32.9 (L) 36.0 - 46.0 %   MCV 86.8 80.0 - 100.0 fL   MCH 26.9 26.0 - 34.0 pg   MCHC 31.0 30.0 - 36.0 g/dL   RDW 16.2 (H) 11.5 - 15.5 %   Platelets 400 150 - 400 K/uL   nRBC 0.0 0.0 - 0.2 %   Neutrophils Relative % 85 %   Neutro Abs 21.1 (H) 1.7 - 7.7 K/uL   Lymphocytes Relative 6 %   Lymphs Abs 1.4 0.7 - 4.0 K/uL   Monocytes Relative 6 %   Monocytes Absolute 1.5 (H) 0.1 - 1.0 K/uL   Eosinophils Relative 0 %   Eosinophils Absolute 0.0 0.0 -  0.5 K/uL   Basophils Relative 0 %   Basophils Absolute 0.0 0.0 - 0.1 K/uL   Immature Granulocytes 3 %   Abs Immature Granulocytes 0.72 (H) 0.00 - 0.07 K/uL    Comment: Performed at [facility], [address] [place], [place] [address]  Basic metabolic panel     Status: Abnormal   Collection Time: 01/02/19 10:33 AM  Result Value Ref Range   Sodium 142 135 - 145 mmol/L   Potassium 4.2 3.5 - 5.1 mmol/L   Chloride 102 98 - 111 mmol/L   CO2 30 22 - 32 mmol/L   Glucose, Bld 200 (H) 70 - 99 mg/dL   BUN 46 (H) 8 - 23 mg/dL   Creatinine, Ser 6.040.86 0.44 - 1.00 mg/dL   Calcium 8.4 (L) 8.9 - 10.3 mg/dL   GFR calc non Af Amer >60 >60 mL/min   GFR calc Af Amer >60 >60 mL/min   Anion gap 10 5 - 15    Comment: Performed at [facility], [address] [place], [place] [address]  Magnesium     Status: None   Collection Time: 01/02/19 10:33 AM  Result Value Ref Range   Magnesium 1.9 1.7 - 2.4 mg/dL    Comment: Performed at [facility], [address] [place], [place] [address]     ROS: Not obtainable            There were no vitals  taken for this visit.  Physical exam:   General--white female heavily sedated intubated on the ventilator ENT--endotracheal tube in place nonicteric Neck--no gross masses Heart--tachycardia Lungs--ventilator respiratory sounds Abdomen--distended and somewhat tight but bowel sounds present Rectal- large amount of liquid stool no masses no impaction.  Stool brown sent for guaiac.  Rectal tube placed and will be placed to low suction Psych--not obtainable   Assessment: 1.  Colonic pseudoobstruction.  Confusing picture.  Patient just had colonoscopy less than a month ago with no obvious obstruction.  According to the husband the patient has had increasing abdominal discomfort superimposed on chronic back pain since the colonoscopy.  Her WBC is elevated but she has Pseudomonas pneumonia and is on high-dose steroids.  She also is receiving fentanyl for sedation which could be causing colonic ileus.  She will need decompression if were unable to improve things. 2.  Respiratory failure following cardiac arrest.  Patient on the ventilator heavily sedated 3.  Abdominal pain.  This was worse after colonoscopy.  CT does not clearly show abscess etc.  Patient did have colonoscopy to the cecum.4.  Chronic back pain 4.  Chronic COPD  Plan: 1.  We will continue suction of the rectal tube.  We will also try to taper off of the fentanyl drip and sedate with propofol.  Fentanyl may be contributing to the colonic ileus. 2.  Obtain KUB in the morning.  If no significant improvement, will need compressive sigmoidoscopy or colonoscopy.  I have discussed this in detail with the patient's husband and verbal consent has been obtained for Dr. Levora AngelBrahmbhatt or Dr Ewing SchleinMagod.  We will plan on bringing the portable cart up and doing the procedure right at the bedside. 3.  We will try to keep potassium between 4.5 and 5.0.  [facility] will continue to follow.   Bianca Willis 01/02/2019, 5:47 PM   This note was created using  voice recognition software and minor errors may Have occurred unintentionally. Pager: [phone] If no answer or after  hours call [phone]

## 2019-01-02 NOTE — Progress Notes (Addendum)
Pulmonary Critical Care Medicine [facility]   PULMONARY CRITICAL CARE SERVICE  PROGRESS NOTE  Date of Service: 01/02/2019  [facility]  ZOX:[id number]  DOB: 03/11/1951   DOA: 12/30/2018  Referring Physician: Carron CurieAli Hijazi, MD  HPI: Bianca PieriniJudy Willis Willis is a 68 y.o. female seen for follow up of Acute on Chronic Respiratory Failure.  Patient seen_12/5 FiO2 35%.  Has a current goal of 12 hours.  Satting well with no distress.  Medications: Reviewed on Rounds  Physical Exam:  Vitals: Pulse 100 respirations 18 BP 172/72 O2 sat 94% temp 7.9  Ventilator Settings pressure support 12/5 FiO2 35%  . General: Comfortable at this time . Eyes: Grossly normal lids, irises & conjunctiva . ENT: grossly tongue is normal . Neck: no obvious mass . Cardiovascular: S1 S2 normal no gallop . Respiratory: Coarse breath sounds . Abdomen: soft . Skin: no rash seen on limited exam . Musculoskeletal: not rigid . Psychiatric:unable to assess . Neurologic: no seizure no involuntary movements         Lab Data:   Basic Metabolic Panel: Recent Labs  Lab 12/31/18 0756 01/01/19 0807 01/01/19 1818 01/02/19 1033  NA 140 141 143 142  K 4.6 4.2 4.3 4.2  CL 101 105 102 102  CO2 27 27 31 30   GLUCOSE 180* 250* 251* 200*  BUN 48* 49* 47* 46*  CREATININE 0.86 0.83 0.78 0.86  CALCIUM 9.4 9.1 8.9 8.4*  MG  --  1.9 1.7 1.9  PHOS  --  4.2  --   --     ABG: Recent Labs  Lab 12/31/18 0515 01/01/19 0422  PHART 7.436 7.418  PCO2ART 45.3 50.6*  PO2ART 63.9* 66.6*  HCO3 30.0* 32.1*  O2SAT 89.6 89.7    Liver Function Tests: Recent Labs  Lab 01/01/19 0807  AST 29  ALT 42  ALKPHOS 87  BILITOT 0.3  PROT 5.1*  ALBUMIN 2.4*   No results for input(s): LIPASE, AMYLASE in the last 168 hours. No results for input(s): AMMONIA in the last 168 hours.  CBC: Recent Labs  Lab 12/31/18 0756 01/01/19 0807 01/02/19 1033  WBC 28.5* 22.7* 24.7*  NEUTROABS 23.2* 18.8* 21.1*  HGB  11.8* 10.6* 10.2*  HCT 37.6 33.7* 32.9*  MCV 85.5 86.9 86.8  PLT 448* 409* 400    Cardiac Enzymes: No results for input(s): CKTOTAL, CKMB, CKMBINDEX, TROPONINI in the last 168 hours.  BNP (last 3 results) No results for input(s): BNP in the last 8760 hours.  ProBNP (last 3 results) No results for input(s): PROBNP in the last 8760 hours.  Radiological Exams: Dg Abd 1 View  Result Date: 01/02/2019 CLINICAL DATA:  Abdominal distension, follow-up. EXAM: ABDOMEN - 1 VIEW COMPARISON:  Plain films of the abdomen dated 01/01/2019 and 12/31/2018. FINDINGS: Enteric tube is stable in position within the stomach. Persistent gaseous distention of both large and small bowel loops, stable compared to the recent plain films. IMPRESSION: Persistent gaseous distention of both large and small bowel loops, not significantly changed compared to the recent plain films. Findings again suggest either colonic obstruction or ileus. Sigmoid volvulus could cause this appearance. Would consider CT abdomen and pelvis for further characterization. These results will be called to the ordering clinician or representative by the Radiologist Assistant, and communication documented in the PACS or zVision Dashboard. Electronically Signed   By: Bary RichardStan  Maynard M.D.   On: 01/02/2019 08:18   Dg Abd 1 View  Result Date: 01/01/2019 CLINICAL DATA:  68 year old female  with a history of abdominal distension EXAM: ABDOMEN - 1 VIEW COMPARISON:  12/31/2018 FINDINGS: Gastric tube terminates in the stomach with decompression of the stomach. Persistent small bowel and colonic distention, with regions of the abdomen not imaged given the patient's body habitus. Degree of distention has subjectively increased compared to the prior. No radiopaque foreign body.  No gas projecting over the liver. Cholecystectomy. IMPRESSION: Persisting dilation of small bowel and colon, which appears progressed from the prior. Bowel obstruction/colonic obstruction not  excluded and further evaluation with CT imaging may be useful. Gastric tube unchanged with decompression of the stomach. Cholecystectomy Electronically Signed   By: Corrie Mckusick D.O.   On: 01/01/2019 12:56   Ct Abdomen Pelvis W Contrast  Result Date: 01/02/2019 CLINICAL DATA:  Bowel obstruction EXAM: CT ABDOMEN AND PELVIS WITH CONTRAST TECHNIQUE: Multidetector CT imaging of the abdomen and pelvis was performed using the standard protocol following bolus administration of intravenous contrast. CONTRAST:  160mL OMNIPAQUE IOHEXOL 300 MG/ML  SOLN COMPARISON:  None. FINDINGS: Lower chest: Small bilateral pleural effusions and associated atelectasis or consolidation. Cardiomegaly. Partially imaged mass in the central right breast measuring 1.2 cm (series 3, image 1). Hepatobiliary: No focal liver abnormality is seen. Status post cholecystectomy. No biliary dilatation. Pancreas: Unremarkable. No pancreatic ductal dilatation or surrounding inflammatory changes. Spleen: Normal in size without significant abnormality. Adrenals/Urinary Tract: Adrenal glands are unremarkable. Kidneys are normal, without renal calculi, solid lesion, or hydronephrosis. Foley catheter in the urinary bladder. Stomach/Bowel: Stomach is within normal limits. The appendix is surgically absent. The colon is very redundant and distended, and the cecum in particular is grossly distended measuring at least 12 cm in caliber (series 8, image 58). There is no definite bascule, volvulus or other obstructive configuration, and the colon is gas filled to the mid sigmoid without obvious transition point, mass, or stricture (series 3, image 74). The small bowel is nondistended. Esophagogastric tube is looped in the stomach with tip and side port in the gastric body. Vascular/Lymphatic: Aortic atherosclerosis. No enlarged abdominal or pelvic lymph nodes. Reproductive: Status post hysterectomy. Other: Anasarca.  No abdominopelvic ascites. Musculoskeletal: No  acute or significant osseous findings. IMPRESSION: 1. The colon is very redundant and distended, and the cecum in particular is grossly distended measuring at least 12 cm in caliber (series 8, image 58). There is no definite bascule, volvulus or other obstructive configuration, and the colon is gas filled to the mid sigmoid without obvious transition point, mass, or stricture (series 3, image 74). The small bowel is nondistended. Findings may reflect chronic pseudo-obstruction or toxic megacolon. 2. Chronic, incidental, and postoperative findings as detailed above. Electronically Signed   By: Eddie Candle M.D.   On: 01/02/2019 11:17   Dg Chest Port 1 View  Result Date: 01/01/2019 CLINICAL DATA:  Respiratory failure EXAM: PORTABLE CHEST 1 VIEW COMPARISON:  Yesterday FINDINGS: Endotracheal tube tip just below the clavicular heads. Left IJ line and orogastric tube in stable position. Indistinct opacities at the lung bases. No Kerley lines or pneumothorax. Gas distended bowel over the upper abdomen. Normal heart size. IMPRESSION: 1. Unremarkable hardware positioning. 2. Mild opacity at the bases favoring atelectasis. 3. Gaseous distension of bowel in the upper abdomen. Electronically Signed   By: Monte Fantasia M.D.   On: 01/01/2019 08:23    Assessment/Plan Active Problems:   Acute on chronic respiratory failure with hypoxia (HCC)   Cardiac arrest (HCC)   Ventilator dependent (HCC)   Endotracheally intubated   Obstructive chronic bronchitis  without exacerbation (HCC)   Nonspecific abdominal pain   1. Acute on chronic respiratory failure with hypoxia continue with ventilator support at this time.  Patient remains on propofol and fentanyl.  Continue pressure support trial today for goal of 12 hours on 35% 12/5.  Continue supportive measures and pulmonary toilet 2. Cardiac arrest rhythm is stable continue to monitor 3. COPD history, continue current medication supportive measures. 4. Abdominal pain,  continue to monitor at this time.   I have personally seen and evaluated the patient, evaluated laboratory and imaging results, formulated the assessment and plan and placed orders. The Patient requires high complexity decision making for assessment and support.  Case was discussed on Rounds with the Respiratory Therapy Staff  Yevonne PaxSaadat A Khan, MD [facility] Pulmonary Critical Care Medicine Sleep Medicine

## 2019-01-03 ENCOUNTER — Other Ambulatory Visit (HOSPITAL_COMMUNITY): Payer: Medicare Other

## 2019-01-03 ENCOUNTER — Encounter
Admission: RE | Disposition: A | Discharge status: Skilled Nursing Facility | Payer: Self-pay | Source: Other Acute Inpatient Hospital | Attending: Internal Medicine

## 2019-01-03 DIAGNOSIS — J9621 Acute and chronic respiratory failure with hypoxia: Secondary | ICD-10-CM | POA: Diagnosis not present

## 2019-01-03 DIAGNOSIS — Z9289 Personal history of other medical treatment: Secondary | ICD-10-CM | POA: Diagnosis not present

## 2019-01-03 DIAGNOSIS — I469 Cardiac arrest, cause unspecified: Secondary | ICD-10-CM | POA: Diagnosis not present

## 2019-01-03 DIAGNOSIS — J449 Chronic obstructive pulmonary disease, unspecified: Secondary | ICD-10-CM | POA: Diagnosis not present

## 2019-01-03 LAB — CBC WITH DIFFERENTIAL/PLATELET
Abs Immature Granulocytes: 0.39 10*3/uL — ABNORMAL HIGH (ref 0.00–0.07)
Basophils Absolute: 0 10*3/uL (ref 0.0–0.1)
Basophils Relative: 0 %
Eosinophils Absolute: 0 10*3/uL (ref 0.0–0.5)
Eosinophils Relative: 0 %
HCT: 28.7 % — ABNORMAL LOW (ref 36.0–46.0)
Hemoglobin: 8.9 g/dL — ABNORMAL LOW (ref 12.0–15.0)
Immature Granulocytes: 2 %
Lymphocytes Relative: 7 %
Lymphs Abs: 1.3 10*3/uL (ref 0.7–4.0)
MCH: 27.2 pg (ref 26.0–34.0)
MCHC: 31 g/dL (ref 30.0–36.0)
MCV: 87.8 fL (ref 80.0–100.0)
Monocytes Absolute: 1 10*3/uL (ref 0.1–1.0)
Monocytes Relative: 6 %
Neutro Abs: 14.4 10*3/uL — ABNORMAL HIGH (ref 1.7–7.7)
Neutrophils Relative %: 85 %
Platelets: 342 10*3/uL (ref 150–400)
RBC: 3.27 MIL/uL — ABNORMAL LOW (ref 3.87–5.11)
RDW: 15.9 % — ABNORMAL HIGH (ref 11.5–15.5)
WBC: 17.1 10*3/uL — ABNORMAL HIGH (ref 4.0–10.5)
nRBC: 0 % (ref 0.0–0.2)

## 2019-01-03 LAB — BASIC METABOLIC PANEL
Anion gap: 9 (ref 5–15)
BUN: 42 mg/dL — ABNORMAL HIGH (ref 8–23)
CO2: 30 mmol/L (ref 22–32)
Calcium: 8 mg/dL — ABNORMAL LOW (ref 8.9–10.3)
Chloride: 104 mmol/L (ref 98–111)
Creatinine, Ser: 0.72 mg/dL (ref 0.44–1.00)
GFR calc Af Amer: >60 mL/min (ref >60)
GFR calc non Af Amer: >60 mL/min (ref >60)
Glucose, Bld: 194 mg/dL — ABNORMAL HIGH (ref 70–99)
Potassium: 4.3 mmol/L (ref 3.5–5.1)
Sodium: 143 mmol/L (ref 135–145)

## 2019-01-03 LAB — MAGNESIUM: Magnesium: 2.1 mg/dL (ref 1.7–2.4)

## 2019-01-03 LAB — TRIGLYCERIDES: Triglycerides: 339 mg/dL — ABNORMAL HIGH (ref <150)

## 2019-01-03 SURGERY — COLONOSCOPY WITH PROPOFOL
Anesthesia: Monitor Anesthesia Care

## 2019-01-03 MED ORDER — GENERIC EXTERNAL MEDICATION
Status: DC
Start: ? — End: 2019-01-03

## 2019-01-03 NOTE — Progress Notes (Signed)
[facility] Progress Note  Bianca Willis 68 y.o. 09-28-1950  CC: Colonic ileus/pseudoobstruction   Subjective: Patient seen and examined at bedside.  Discussed with nurse practitioner as well as RN.  Abdominal distention has improved compared to yesterday on physical exam.  Patient is passing gas.  Had small amount of stool yesterday.  ROS : Not able to obtain   Objective: Vital signs in last 24 hours: There were no vitals filed for this visit.  Physical Exam:  General.  Patient is alert but intubated. Abdomen.  Soft, minimally distended, hypoactive bowel sounds, no peritoneal signs.  Lab Results: Recent Labs    01/01/19 0807 01/01/19 1818 01/02/19 1033  NA 141 143 142  K 4.2 4.3 4.2  CL 105 102 102  CO2 27 31 30   GLUCOSE 250* 251* 200*  BUN 49* 47* 46*  CREATININE 0.83 0.78 0.86  CALCIUM 9.1 8.9 8.4*  MG 1.9 1.7 1.9  PHOS 4.2  --   --    Recent Labs    01/01/19 0807  AST 29  ALT 42  ALKPHOS 87  BILITOT 0.3  PROT 5.1*  ALBUMIN 2.4*   Recent Labs    01/01/19 0807 01/02/19 1033  WBC 22.7* 24.7*  NEUTROABS 18.8* 21.1*  HGB 10.6* 10.2*  HCT 33.7* 32.9*  MCV 86.9 86.8  PLT 409* 400   No results for input(s): LABPROT, INR in the last 72 hours.    Assessment/Plan: -Colonic distention.  Most likely ileus/pseudoobstruction.    Recommendations ---------------------------  Abdomen is soft on physical exam today.  Discussed with nurse practitioner as well as RN.  Based on the description, patient's abdominal distention has improved significantly compared to yesterday.  Although there is clinical improvement, x-ray this morning showed persistent gaseous distention.  -Supportive care for another day.  Repeat x-ray in the morning.  If continues to have distended colon on repeat x-ray tomorrow, then consider flexible sigmoidoscopy/colonoscopy for decompression.  GI will follow   Otis Brace MD, [place] 01/03/2019, 9:10 AM  Contact #   [phone]

## 2019-01-03 NOTE — Progress Notes (Signed)
[facility]   PULMONARY CRITICAL CARE SERVICE  PROGRESS NOTE  Date of Service: 01/03/2019  Bianca [facility]Judy [facility]allow[facility]  ZOX:[id number]  DOB: 03/24/1951   DOA: 12/30/2018  Referring Physician: Carron CurieAli Hijazi, MD  HPI: Bianca PieriniJudy Galloway Willis is a 68 y.o. female seen for follow up of Acute on Chronic Respiratory Failure.  Patient currently is on pressure support mode on 35% FiO2 pressure support 12/5 and is tolerating the wean  Medications: Reviewed on Rounds  Physical Exam:  Vitals: Temperature 98.7 pulse 74 respiratory rate 18 blood pressure 165/65 saturations 94%  Ventilator Settings mode ventilation pressure support FiO2 35% pressure support 12 PEEP 5  . General: Comfortable at this time . Eyes: Grossly normal lids, irises & conjunctiva . ENT: grossly tongue is normal . Neck: no obvious mass . Cardiovascular: S1 S2 normal no gallop . Respiratory: No rhonchi no rales are noted at this time . Abdomen: soft . Skin: no rash seen on limited exam . Musculoskeletal: not rigid . Psychiatric:unable to assess . Neurologic: no seizure no involuntary movements         Lab Data:   Basic Metabolic Panel: Recent Labs  Lab 12/31/18 0756 01/01/19 0807 01/01/19 1818 01/02/19 1033 01/03/19 0829  NA 140 141 143 142 143  K 4.6 4.2 4.3 4.2 4.3  CL 101 105 102 102 104  CO2 27 27 31 30 30   GLUCOSE 180* 250* 251* 200* 194*  BUN 48* 49* 47* 46* 42*  CREATININE 0.86 0.83 0.78 0.86 0.72  CALCIUM 9.4 9.1 8.9 8.4* 8.0*  MG  --  1.9 1.7 1.9 2.1  PHOS  --  4.2  --   --   --     ABG: Recent Labs  Lab 12/31/18 0515 01/01/19 0422  PHART 7.436 7.418  PCO2ART 45.3 50.6*  PO2ART 63.9* 66.6*  HCO3 30.0* 32.1*  O2SAT 89.6 89.7    Liver Function Tests: Recent Labs  Lab 01/01/19 0807  AST 29  ALT 42  ALKPHOS 87  BILITOT 0.3  PROT 5.1*  ALBUMIN 2.4*   No results for input(s): LIPASE, AMYLASE in the last 168 hours. No results for input(s):  AMMONIA in the last 168 hours.  CBC: Recent Labs  Lab 12/31/18 0756 01/01/19 0807 01/02/19 1033 01/03/19 0829  WBC 28.5* 22.7* 24.7* 17.1*  NEUTROABS 23.2* 18.8* 21.1* 14.4*  HGB 11.8* 10.6* 10.2* 8.9*  HCT 37.6 33.7* 32.9* 28.7*  MCV 85.5 86.9 86.8 87.8  PLT 448* 409* 400 342    Cardiac Enzymes: No results for input(s): CKTOTAL, CKMB, CKMBINDEX, TROPONINI in the last 168 hours.  BNP (last 3 results) No results for input(s): BNP in the last 8760 hours.  ProBNP (last 3 results) No results for input(s): PROBNP in the last 8760 hours.  Radiological Exams: Dg Abd 1 View  Result Date: 01/02/2019 CLINICAL DATA:  Abdominal distension, follow-up. EXAM: ABDOMEN - 1 VIEW COMPARISON:  Plain films of the abdomen dated 01/01/2019 and 12/31/2018. FINDINGS: Enteric tube is stable in position within the stomach. Persistent gaseous distention of both large and small bowel loops, stable compared to the recent plain films. IMPRESSION: Persistent gaseous distention of both large and small bowel loops, not significantly changed compared to the recent plain films. Findings again suggest either colonic obstruction or ileus. Sigmoid volvulus could cause this appearance. Would consider CT abdomen and pelvis for further characterization. These results will be called to the ordering clinician or representative by the Radiologist Assistant, and communication documented  in the PACS or zVision Dashboard. Electronically Signed   By: Franki Cabot M.D.   On: 01/02/2019 08:18   Ct Abdomen Pelvis W Contrast  Result Date: 01/02/2019 CLINICAL DATA:  Bowel obstruction EXAM: CT ABDOMEN AND PELVIS WITH CONTRAST TECHNIQUE: Multidetector CT imaging of the abdomen and pelvis was performed using the standard protocol following bolus administration of intravenous contrast. CONTRAST:  171mL OMNIPAQUE IOHEXOL 300 MG/ML  SOLN COMPARISON:  None. FINDINGS: Lower chest: Small bilateral pleural effusions and associated atelectasis or  consolidation. Cardiomegaly. Partially imaged mass in the central right breast measuring 1.2 cm (series 3, image 1). Hepatobiliary: No focal liver abnormality is seen. Status post cholecystectomy. No biliary dilatation. Pancreas: Unremarkable. No pancreatic ductal dilatation or surrounding inflammatory changes. Spleen: Normal in size without significant abnormality. Adrenals/Urinary Tract: Adrenal glands are unremarkable. Kidneys are normal, without renal calculi, solid lesion, or hydronephrosis. Foley catheter in the urinary bladder. Stomach/Bowel: Stomach is within normal limits. The appendix is surgically absent. The colon is very redundant and distended, and the cecum in particular is grossly distended measuring at least 12 cm in caliber (series 8, image 58). There is no definite bascule, volvulus or other obstructive configuration, and the colon is gas filled to the mid sigmoid without obvious transition point, mass, or stricture (series 3, image 74). The small bowel is nondistended. Esophagogastric tube is looped in the stomach with tip and side port in the gastric body. Vascular/Lymphatic: Aortic atherosclerosis. No enlarged abdominal or pelvic lymph nodes. Reproductive: Status post hysterectomy. Other: Anasarca.  No abdominopelvic ascites. Musculoskeletal: No acute or significant osseous findings. IMPRESSION: 1. The colon is very redundant and distended, and the cecum in particular is grossly distended measuring at least 12 cm in caliber (series 8, image 58). There is no definite bascule, volvulus or other obstructive configuration, and the colon is gas filled to the mid sigmoid without obvious transition point, mass, or stricture (series 3, image 74). The small bowel is nondistended. Findings may reflect chronic pseudo-obstruction or toxic megacolon. 2. Chronic, incidental, and postoperative findings as detailed above. Electronically Signed   By: Eddie Candle M.D.   On: 01/02/2019 11:17   Dg Abd Portable  1v  Result Date: 01/03/2019 CLINICAL DATA:  Abdominal distention EXAM: PORTABLE ABDOMEN - 1 VIEW COMPARISON:  Abdominal CT from yesterday FINDINGS: Nasogastric tube with tip and side-port over the stomach. Generalized gaseous distention of the colon with the cecum extending into the left abdomen and measuring 14 cm in diameter. No dilated small bowel. No concerning mass effect or gas collection. IMPRESSION: Continued gaseous distention of the colon favoring ileus, with cecum measuring 14 cm in diameter. Electronically Signed   By: Monte Fantasia M.D.   On: 01/03/2019 06:37    Assessment/Plan Active Problems:   Acute on chronic respiratory failure with hypoxia (HCC)   Cardiac arrest (HCC)   Ventilator dependent (HCC)   Endotracheally intubated   Obstructive chronic bronchitis without exacerbation (HCC)   Nonspecific abdominal pain   1. Acute on chronic respiratory failure with hypoxia we will continue with weaning on pressure support the goal is 16 hours today 2. Cardiac arrest rhythm is stable we will continue to follow 3. Nonspecific abdominal pain seen by gastroenterology follow their recommendations 4. Vent dependence patient remains on the ventilator and full support weaning on pressure support 5. COPD at baseline nebulizers as necessary   I have personally seen and evaluated the patient, evaluated laboratory and imaging results, formulated the assessment and plan and placed orders.  The Patient requires high complexity decision making for assessment and support.  Case was discussed on Rounds with the Respiratory Therapy Staff  Allyne Gee, MD [facility] Pulmonary Critical Care Medicine Sleep Medicine

## 2019-01-04 ENCOUNTER — Other Ambulatory Visit (HOSPITAL_COMMUNITY): Payer: Medicare Other

## 2019-01-04 DIAGNOSIS — J9621 Acute and chronic respiratory failure with hypoxia: Secondary | ICD-10-CM | POA: Diagnosis not present

## 2019-01-04 DIAGNOSIS — J449 Chronic obstructive pulmonary disease, unspecified: Secondary | ICD-10-CM | POA: Diagnosis not present

## 2019-01-04 DIAGNOSIS — Z9289 Personal history of other medical treatment: Secondary | ICD-10-CM | POA: Diagnosis not present

## 2019-01-04 DIAGNOSIS — I469 Cardiac arrest, cause unspecified: Secondary | ICD-10-CM | POA: Diagnosis not present

## 2019-01-04 LAB — CBC WITH DIFFERENTIAL/PLATELET
Abs Immature Granulocytes: 0.9 10*3/uL — ABNORMAL HIGH (ref 0.00–0.07)
Basophils Absolute: 0 10*3/uL (ref 0.0–0.1)
Basophils Relative: 0 %
Eosinophils Absolute: 0 10*3/uL (ref 0.0–0.5)
Eosinophils Relative: 0 %
HCT: 30.1 % — ABNORMAL LOW (ref 36.0–46.0)
Hemoglobin: 9.6 g/dL — ABNORMAL LOW (ref 12.0–15.0)
Immature Granulocytes: 6 %
Lymphocytes Relative: 16 %
Lymphs Abs: 2.3 10*3/uL (ref 0.7–4.0)
MCH: 27.2 pg (ref 26.0–34.0)
MCHC: 31.9 g/dL (ref 30.0–36.0)
MCV: 85.3 fL (ref 80.0–100.0)
Monocytes Absolute: 0.9 10*3/uL (ref 0.1–1.0)
Monocytes Relative: 6 %
Neutro Abs: 10.2 10*3/uL — ABNORMAL HIGH (ref 1.7–7.7)
Neutrophils Relative %: 72 %
Platelets: 343 10*3/uL (ref 150–400)
RBC: 3.53 MIL/uL — ABNORMAL LOW (ref 3.87–5.11)
RDW: 15.9 % — ABNORMAL HIGH (ref 11.5–15.5)
WBC: 14.4 10*3/uL — ABNORMAL HIGH (ref 4.0–10.5)
nRBC: 0 % (ref 0.0–0.2)

## 2019-01-04 LAB — BASIC METABOLIC PANEL
Anion gap: 4 — ABNORMAL LOW (ref 5–15)
BUN: 44 mg/dL — ABNORMAL HIGH (ref 8–23)
CO2: 30 mmol/L (ref 22–32)
Calcium: 6.6 mg/dL — ABNORMAL LOW (ref 8.9–10.3)
Chloride: 106 mmol/L (ref 98–111)
Creatinine, Ser: 0.68 mg/dL (ref 0.44–1.00)
GFR calc Af Amer: >60 mL/min (ref >60)
GFR calc non Af Amer: >60 mL/min (ref >60)
Glucose, Bld: 148 mg/dL — ABNORMAL HIGH (ref 70–99)
Potassium: 4.5 mmol/L (ref 3.5–5.1)
Sodium: 140 mmol/L (ref 135–145)

## 2019-01-04 LAB — MAGNESIUM: Magnesium: 1.9 mg/dL (ref 1.7–2.4)

## 2019-01-04 MED ORDER — GENERIC EXTERNAL MEDICATION
Status: DC
Start: ? — End: 2019-01-04

## 2019-01-04 NOTE — Progress Notes (Signed)
[facility] Gastroenterology Progress Note  Bianca Willis 68 y.o. [birth year]  CC: Colonic ileus/pseudoobstruction   Subjective: Patient seen and examined at bedside.  No acute issues noted.  Discussed with nurse practitioner.  Small amount of coffee-ground material noted in NG suction.  No black stool.  ROS : Not able to obtain   Objective: Vital signs in last 24 hours: There were no vitals filed for this visit.  Physical Exam:  General.  Patient is alert but intubated. Abdomen.  Soft, minimally distended, hypoactive bowel sounds, no peritoneal signs.  Lab Results: Recent Labs    01/03/19 0829 01/04/19 0622  NA 143 140  K 4.3 4.5  CL 104 106  CO2 30 30  GLUCOSE 194* 148*  BUN 42* 44*  CREATININE 0.72 0.68  CALCIUM 8.0* 6.6*  MG 2.1 1.9   No results for input(s): AST, ALT, ALKPHOS, BILITOT, PROT, ALBUMIN in the last 72 hours. Recent Labs    01/03/19 0829 01/04/19 1033  WBC 17.1* 14.4*  NEUTROABS 14.4* PENDING  HGB 8.9* 9.6*  HCT 28.7* 30.1*  MCV 87.8 85.3  PLT 342 343   No results for input(s): LABPROT, INR in the last 72 hours.    Assessment/Plan: -Colonic distention.  Most likely ileus/pseudoobstruction.  Improving -Coffee-ground material in NG suction.  Could be from stress ulcer or gastritis.  Hemoglobin relatively stable.  Recommendations --------------------------- -Repeat x-ray this morning showed improvement in colonic ileus.  Continue conservative management. -Started on PPI. -Supportive care -Recommend repeat x-ray in 2 days.  No further GI work-up planned.  GI will sign off.  Call us back if needed   Otis Brace MD, [place] 01/04/2019, 1:03 PM  Contact #  [phone]

## 2019-01-04 NOTE — Progress Notes (Addendum)
[facility]   PULMONARY CRITICAL CARE SERVICE  PROGRESS NOTE  Date of Service: 01/04/2019  Bianca Willis  ZOX:[id number]  DOB: 04/25/1951   DOA: 12/30/2018  Referring Physician: Carron CurieAli Hijazi, MD  HPI: Bianca Willis is a 68 y.o. female seen for follow up of Acute on Chronic Respiratory Failure.  Patient has a 20-hour goal today on pressure support 12/5 with an FiO2 of 30%.  Currently satting well with no distress.  Medications: Reviewed on Rounds  Physical Exam:  Vitals: Pulse 67 respirations 18 BP 130/74 O2 sat 100% temp 97.4  Ventilator Settings per support 12/5 FiO2 30%  . General: Comfortable at this time . Eyes: Grossly normal lids, irises & conjunctiva . ENT: grossly tongue is normal . Neck: no obvious mass . Cardiovascular: S1 S2 normal no gallop . Respiratory: No rales or rhonchi noted . Abdomen: soft . Skin: no rash seen on limited exam . Musculoskeletal: not rigid . Psychiatric:unable to assess . Neurologic: no seizure no involuntary movements         Lab Data:   Basic Metabolic Panel: Recent Labs  Lab 01/01/19 0807 01/01/19 1818 01/02/19 1033 01/03/19 0829 01/04/19 0622  NA 141 143 142 143 140  K 4.2 4.3 4.2 4.3 4.5  CL 105 102 102 104 106  CO2 27 31 30 30 30   GLUCOSE 250* 251* 200* 194* 148*  BUN 49* 47* 46* 42* 44*  CREATININE 0.83 0.78 0.86 0.72 0.68  CALCIUM 9.1 8.9 8.4* 8.0* 6.6*  MG 1.9 1.7 1.9 2.1 1.9  PHOS 4.2  --   --   --   --     ABG: Recent Labs  Lab 12/31/18 0515 01/01/19 0422  PHART 7.436 7.418  PCO2ART 45.3 50.6*  PO2ART 63.9* 66.6*  HCO3 30.0* 32.1*  O2SAT 89.6 89.7    Liver Function Tests: Recent Labs  Lab 01/01/19 0807  AST 29  ALT 42  ALKPHOS 87  BILITOT 0.3  PROT 5.1*  ALBUMIN 2.4*   No results for input(s): LIPASE, AMYLASE in the last 168 hours. No results for input(s): AMMONIA in the last 168 hours.  CBC: Recent Labs  Lab 12/31/18 0756  01/01/19 0807 01/02/19 1033 01/03/19 0829 01/04/19 1033  WBC 28.5* 22.7* 24.7* 17.1* 14.4*  NEUTROABS 23.2* 18.8* 21.1* 14.4* 10.2*  HGB 11.8* 10.6* 10.2* 8.9* 9.6*  HCT 37.6 33.7* 32.9* 28.7* 30.1*  MCV 85.5 86.9 86.8 87.8 85.3  PLT 448* 409* 400 342 343    Cardiac Enzymes: No results for input(s): CKTOTAL, CKMB, CKMBINDEX, TROPONINI in the last 168 hours.  BNP (last 3 results) No results for input(s): BNP in the last 8760 hours.  ProBNP (last 3 results) No results for input(s): PROBNP in the last 8760 hours.  Radiological Exams: Dg Abd Portable 1v  Result Date: 01/04/2019 CLINICAL DATA:  Ileus EXAM: PORTABLE ABDOMEN - 1 VIEW COMPARISON:  Yesterday FINDINGS: Nasogastric tube in stable position. Gaseous distension of colon which is improved from yesterday, cecum (which crosses midline by CT) is 11 cm diameter compared to 15 cm. IMPRESSION: Colonic ileus with improving distention when compared yesterday. Electronically Signed   By: Marnee SpringJonathon  Watts M.D.   On: 01/04/2019 06:42   Dg Abd Portable 1v  Result Date: 01/03/2019 CLINICAL DATA:  Abdominal distention EXAM: PORTABLE ABDOMEN - 1 VIEW COMPARISON:  Abdominal CT from yesterday FINDINGS: Nasogastric tube with tip and side-port over the stomach. Generalized gaseous distention of the colon with the cecum  extending into the left abdomen and measuring 14 cm in diameter. No dilated small bowel. No concerning mass effect or gas collection. IMPRESSION: Continued gaseous distention of the colon favoring ileus, with cecum measuring 14 cm in diameter. Electronically Signed   By: Monte Fantasia M.D.   On: 01/03/2019 06:37    Assessment/Plan Active Problems:   Acute on chronic respiratory failure with hypoxia (HCC)   Cardiac arrest (HCC)   Ventilator dependent (HCC)   Endotracheally intubated   Obstructive chronic bronchitis without exacerbation (HCC)   Nonspecific abdominal pain   1. Acute on chronic respiratory failure with hypoxia we  will continue with weaning on pressure support the goal is 20 hours today 2. Cardiac arrest rhythm is stable we will continue to follow 3. Nonspecific abdominal pain seen by gastroenterology follow their recommendations 4. Vent dependence patient remains on the ventilator and full support weaning on pressure support 5. COPD at baseline nebulizers as necessary   I have personally seen and evaluated the patient, evaluated laboratory and imaging results, formulated the assessment and plan and placed orders. The Patient requires high complexity decision making for assessment and support.  Case was discussed on Rounds with the Respiratory Therapy Staff  Allyne Gee, MD [facility] Pulmonary Critical Care Medicine Sleep Medicine

## 2019-01-05 DIAGNOSIS — I469 Cardiac arrest, cause unspecified: Secondary | ICD-10-CM | POA: Diagnosis not present

## 2019-01-05 DIAGNOSIS — J9621 Acute and chronic respiratory failure with hypoxia: Secondary | ICD-10-CM | POA: Diagnosis not present

## 2019-01-05 DIAGNOSIS — J449 Chronic obstructive pulmonary disease, unspecified: Secondary | ICD-10-CM | POA: Diagnosis not present

## 2019-01-05 DIAGNOSIS — Z9289 Personal history of other medical treatment: Secondary | ICD-10-CM | POA: Diagnosis not present

## 2019-01-05 LAB — CBC
HCT: 32.7 % — ABNORMAL LOW (ref 36.0–46.0)
Hemoglobin: 10.3 g/dL — ABNORMAL LOW (ref 12.0–15.0)
MCH: 27.1 pg (ref 26.0–34.0)
MCHC: 31.5 g/dL (ref 30.0–36.0)
MCV: 86.1 fL (ref 80.0–100.0)
Platelets: 339 10*3/uL (ref 150–400)
RBC: 3.8 MIL/uL — ABNORMAL LOW (ref 3.87–5.11)
RDW: 15.5 % (ref 11.5–15.5)
WBC: 12.1 10*3/uL — ABNORMAL HIGH (ref 4.0–10.5)
nRBC: 0 % (ref 0.0–0.2)

## 2019-01-05 LAB — COMPREHENSIVE METABOLIC PANEL
ALT: 61 U/L — ABNORMAL HIGH (ref 0–44)
AST: 41 U/L (ref 15–41)
Albumin: 2.5 g/dL — ABNORMAL LOW (ref 3.5–5.0)
Alkaline Phosphatase: 108 U/L (ref 38–126)
Anion gap: 8 (ref 5–15)
BUN: 36 mg/dL — ABNORMAL HIGH (ref 8–23)
CO2: 32 mmol/L (ref 22–32)
Calcium: 8.8 mg/dL — ABNORMAL LOW (ref 8.9–10.3)
Chloride: 98 mmol/L (ref 98–111)
Creatinine, Ser: 0.64 mg/dL (ref 0.44–1.00)
GFR calc Af Amer: >60 mL/min (ref >60)
GFR calc non Af Amer: >60 mL/min (ref >60)
Glucose, Bld: 177 mg/dL — ABNORMAL HIGH (ref 70–99)
Potassium: 4.4 mmol/L (ref 3.5–5.1)
Sodium: 138 mmol/L (ref 135–145)
Total Bilirubin: 0.9 mg/dL (ref 0.3–1.2)
Total Protein: 5.3 g/dL — ABNORMAL LOW (ref 6.5–8.1)

## 2019-01-05 LAB — CULTURE, BLOOD (ROUTINE X 2)
Culture: NO GROWTH
Culture: NO GROWTH
Special Requests: ADEQUATE

## 2019-01-05 LAB — TRIGLYCERIDES: Triglycerides: 279 mg/dL — ABNORMAL HIGH (ref <150)

## 2019-01-05 LAB — PHOSPHORUS: Phosphorus: 2.6 mg/dL (ref 2.5–4.6)

## 2019-01-05 LAB — MAGNESIUM: Magnesium: 2.4 mg/dL (ref 1.7–2.4)

## 2019-01-05 NOTE — Progress Notes (Addendum)
[facility]   PULMONARY CRITICAL CARE SERVICE  PROGRESS NOTE  Date of Service: 01/05/2019  [facility]Judy [facility]allow[facility]  ZOX:[id number]  DOB: 01/23/1951   DOA: 12/30/2018  Referring Physician: Carron CurieAli Hijazi, MD  HPI: Bianca PieriniJudy Galloway Willis is a 68 y.o. female seen for follow up of Acute on Chronic Respiratory Failure.  Patient continues on pressure support 12/5 with an FiO2 28% for goal of 24 hours this time.  Medications: Reviewed on Rounds  Physical Exam:  Vitals: Pulse 63 respirations 18 BP 137/66 O2 sat 100% temp 97.8  Ventilator Settings pressure support 12/5 FiO2 28%  . General: Comfortable at this time . Eyes: Grossly normal lids, irises & conjunctiva . ENT: grossly tongue is normal . Neck: no obvious mass . Cardiovascular: S1 S2 normal no gallop . Respiratory: No rales or rhonchi noted . Abdomen: soft . Skin: no rash seen on limited exam . Musculoskeletal: not rigid . Psychiatric:unable to assess . Neurologic: no seizure no involuntary movements         Lab Data:   Basic Metabolic Panel: Recent Labs  Lab 01/01/19 0807 01/01/19 1818 01/02/19 1033 01/03/19 0829 01/04/19 0622 01/05/19 0700  NA 141 143 142 143 140 138  K 4.2 4.3 4.2 4.3 4.5 4.4  CL 105 102 102 104 106 98  CO2 27 31 30 30 30  32  GLUCOSE 250* 251* 200* 194* 148* 177*  BUN 49* 47* 46* 42* 44* 36*  CREATININE 0.83 0.78 0.86 0.72 0.68 0.64  CALCIUM 9.1 8.9 8.4* 8.0* 6.6* 8.8*  MG 1.9 1.7 1.9 2.1 1.9 2.4  PHOS 4.2  --   --   --   --  2.6    ABG: Recent Labs  Lab 12/31/18 0515 01/01/19 0422  PHART 7.436 7.418  PCO2ART 45.3 50.6*  PO2ART 63.9* 66.6*  HCO3 30.0* 32.1*  O2SAT 89.6 89.7    Liver Function Tests: Recent Labs  Lab 01/01/19 0807 01/05/19 0700  AST 29 41  ALT 42 61*  ALKPHOS 87 108  BILITOT 0.3 0.9  PROT 5.1* 5.3*  ALBUMIN 2.4* 2.5*   No results for input(s): LIPASE, AMYLASE in the last 168 hours. No results for input(s):  AMMONIA in the last 168 hours.  CBC: Recent Labs  Lab 12/31/18 0756 01/01/19 0807 01/02/19 1033 01/03/19 0829 01/04/19 1033 01/05/19 0700  WBC 28.5* 22.7* 24.7* 17.1* 14.4* 12.1*  NEUTROABS 23.2* 18.8* 21.1* 14.4* 10.2*  --   HGB 11.8* 10.6* 10.2* 8.9* 9.6* 10.3*  HCT 37.6 33.7* 32.9* 28.7* 30.1* 32.7*  MCV 85.5 86.9 86.8 87.8 85.3 86.1  PLT 448* 409* 400 342 343 339    Cardiac Enzymes: No results for input(s): CKTOTAL, CKMB, CKMBINDEX, TROPONINI in the last 168 hours.  BNP (last 3 results) No results for input(s): BNP in the last 8760 hours.  ProBNP (last 3 results) No results for input(s): PROBNP in the last 8760 hours.  Radiological Exams: Dg Abd Portable 1v  Result Date: 01/04/2019 CLINICAL DATA:  Ileus EXAM: PORTABLE ABDOMEN - 1 VIEW COMPARISON:  Yesterday FINDINGS: Nasogastric tube in stable position. Gaseous distension of colon which is improved from yesterday, cecum (which crosses midline by CT) is 11 cm diameter compared to 15 cm. IMPRESSION: Colonic ileus with improving distention when compared yesterday. Electronically Signed   By: Marnee SpringJonathon  Watts M.D.   On: 01/04/2019 06:42    Assessment/Plan Active Problems:   Acute on chronic respiratory failure with hypoxia [facility](HCC)   Cardiac arrest (HCC)  Ventilator dependent ([place])   Endotracheally intubated   Obstructive chronic bronchitis without exacerbation (HCC)   Nonspecific abdominal pain   1. Acute on chronic respiratory failure with hypoxia we will continue with weaning on pressure support the goal is 24 hours today 2. Cardiac arrest rhythm is stable we will continue to follow 3. Nonspecific abdominal pain seen by gastroenterology follow their recommendations 4. Vent dependence patient remains on the ventilator and full support weaning on pressure support 5. COPD at baseline nebulizers as necessary   I have personally seen and evaluated the patient, evaluated laboratory and imaging results, formulated the  assessment and plan and placed orders. The Patient requires high complexity decision making for assessment and support.  Case was discussed on Rounds with the Respiratory Therapy Staff  Allyne Gee, MD [facility] Pulmonary Critical Care Medicine Sleep Medicine

## 2019-01-06 ENCOUNTER — Other Ambulatory Visit (HOSPITAL_COMMUNITY): Payer: Medicare Other

## 2019-01-06 DIAGNOSIS — I469 Cardiac arrest, cause unspecified: Secondary | ICD-10-CM | POA: Diagnosis not present

## 2019-01-06 DIAGNOSIS — Z9289 Personal history of other medical treatment: Secondary | ICD-10-CM | POA: Diagnosis not present

## 2019-01-06 DIAGNOSIS — J449 Chronic obstructive pulmonary disease, unspecified: Secondary | ICD-10-CM | POA: Diagnosis not present

## 2019-01-06 DIAGNOSIS — J9621 Acute and chronic respiratory failure with hypoxia: Secondary | ICD-10-CM | POA: Diagnosis not present

## 2019-01-06 LAB — BASIC METABOLIC PANEL
Anion gap: 10 (ref 5–15)
BUN: 39 mg/dL — ABNORMAL HIGH (ref 8–23)
CO2: 30 mmol/L (ref 22–32)
Calcium: 9 mg/dL (ref 8.9–10.3)
Chloride: 96 mmol/L — ABNORMAL LOW (ref 98–111)
Creatinine, Ser: 0.6 mg/dL (ref 0.44–1.00)
GFR calc Af Amer: >60 mL/min (ref >60)
GFR calc non Af Amer: >60 mL/min (ref >60)
Glucose, Bld: 175 mg/dL — ABNORMAL HIGH (ref 70–99)
Potassium: 3.7 mmol/L (ref 3.5–5.1)
Sodium: 136 mmol/L (ref 135–145)

## 2019-01-06 LAB — TRIGLYCERIDES: Triglycerides: 267 mg/dL — ABNORMAL HIGH (ref <150)

## 2019-01-06 MED ORDER — GENERIC EXTERNAL MEDICATION
Status: DC
Start: ? — End: 2019-01-06

## 2019-01-06 NOTE — Progress Notes (Addendum)
[facility]   PULMONARY CRITICAL CARE SERVICE  PROGRESS NOTE  Date of Service: 01/06/2019  [name]  [id number]  DOB: 01/18/1951   DOA: 12/30/2018  Referring Physician: Carron CurieAli Hijazi, MD  HPI: Bianca Willis is a 68 y.o. female seen for follow up of Acute on Chronic Respiratory Failure.  Patient has a 24-hour goal today on support 12/5 with an FiO2 of 28%.  No distress.  Medications: Reviewed on Rounds  Physical Exam:  Vitals: Pulse 75 respirations 18 BP 94/44 O2 sat 98% temp 96.7  Ventilator Settings pressure support 12/5 FiO2 28%  . General: Comfortable at this time . Eyes: Grossly normal lids, irises & conjunctiva . ENT: grossly tongue is normal . Neck: no obvious mass . Cardiovascular: S1 S2 normal no gallop . Respiratory: No rales rhonchi noted . Abdomen: soft . Skin: no rash seen on limited exam . Musculoskeletal: not rigid . Psychiatric:unable to assess . Neurologic: no seizure no involuntary movements         Lab Data:   Basic Metabolic Panel: Recent Labs  Lab 01/01/19 0807 01/01/19 1818 01/02/19 1033 01/03/19 0829 01/04/19 0622 01/05/19 0700 01/06/19 0620  NA 141 143 142 143 140 138 136  K 4.2 4.3 4.2 4.3 4.5 4.4 3.7  CL 105 102 102 104 106 98 96*  CO2 27 31 30 30 30  32 30  GLUCOSE 250* 251* 200* 194* 148* 177* 175*  BUN 49* 47* 46* 42* 44* 36* 39*  CREATININE 0.83 0.78 0.86 0.72 0.68 0.64 0.60  CALCIUM 9.1 8.9 8.4* 8.0* 6.6* 8.8* 9.0  MG 1.9 1.7 1.9 2.1 1.9 2.4  --   PHOS 4.2  --   --   --   --  2.6  --     ABG: Recent Labs  Lab 12/31/18 0515 01/01/19 0422  PHART 7.436 7.418  PCO2ART 45.3 50.6*  PO2ART 63.9* 66.6*  HCO3 30.0* 32.1*  O2SAT 89.6 89.7    Liver Function Tests: Recent Labs  Lab 01/01/19 0807 01/05/19 0700  AST 29 41  ALT 42 61*  ALKPHOS 87 108  BILITOT 0.3 0.9  PROT 5.1* 5.3*  ALBUMIN 2.4* 2.5*   No results for input(s): LIPASE, AMYLASE in the  last 168 hours. No results for input(s): AMMONIA in the last 168 hours.  CBC: Recent Labs  Lab 12/31/18 0756 01/01/19 0807 01/02/19 1033 01/03/19 0829 01/04/19 1033 01/05/19 0700  WBC 28.5* 22.7* 24.7* 17.1* 14.4* 12.1*  NEUTROABS 23.2* 18.8* 21.1* 14.4* 10.2*  --   HGB 11.8* 10.6* 10.2* 8.9* 9.6* 10.3*  HCT 37.6 33.7* 32.9* 28.7* 30.1* 32.7*  MCV 85.5 86.9 86.8 87.8 85.3 86.1  PLT 448* 409* 400 342 343 339    Cardiac Enzymes: No results for input(s): CKTOTAL, CKMB, CKMBINDEX, TROPONINI in the last 168 hours.  BNP (last 3 results) No results for input(s): BNP in the last 8760 hours.  ProBNP (last 3 results) No results for input(s): PROBNP in the last 8760 hours.  Radiological Exams: Dg Abd Portable 1v  Result Date: 01/06/2019 CLINICAL DATA:  Assess ileus. EXAM: PORTABLE ABDOMEN - 1 VIEW COMPARISON:  January 04, 2019 FINDINGS: Nasogastric tube is identified coiled in the stomach. Gaseous dilatation of the colon is significantly decreased, greatest diameter 5.4 cm. Prior cholecystectomy clips are identified in the right upper quadrant. IMPRESSION: Gaseous dilatation of the colon is significantly decreased, greatest diameter 5.4 cm. Electronically Signed   By: Gabriel CarinaWei-Chen  Lin M.D.  On: 01/06/2019 07:40    Assessment/Plan Active Problems:   Acute on chronic respiratory failure with hypoxia (HCC)   Cardiac arrest (HCC)   Ventilator dependent (HCC)   Endotracheally intubated   Obstructive chronic bronchitis without exacerbation (HCC)   Nonspecific abdominal pain   1. Acute on chronic respiratory failure with hypoxia we will continue with weaning on pressure support the goal is24hours today 2. Cardiac arrest rhythm is stable we will continue to follow 3. Nonspecific abdominal pain seen by gastroenterology follow their recommendations 4. Vent dependence patient remains on the ventilator and full support weaning on pressure support 5. COPD at baseline nebulizers as  necessary   I have personally seen and evaluated the patient, evaluated laboratory and imaging results, formulated the assessment and plan and placed orders. The Patient requires high complexity decision making for assessment and support.  Case was discussed on Rounds with the Respiratory Therapy Staff  Allyne Gee, MD [facility] Pulmonary Critical Care Medicine Sleep Medicine

## 2019-01-07 DIAGNOSIS — J9621 Acute and chronic respiratory failure with hypoxia: Secondary | ICD-10-CM | POA: Diagnosis not present

## 2019-01-07 DIAGNOSIS — J449 Chronic obstructive pulmonary disease, unspecified: Secondary | ICD-10-CM | POA: Diagnosis not present

## 2019-01-07 DIAGNOSIS — Z9289 Personal history of other medical treatment: Secondary | ICD-10-CM | POA: Diagnosis not present

## 2019-01-07 DIAGNOSIS — I469 Cardiac arrest, cause unspecified: Secondary | ICD-10-CM | POA: Diagnosis not present

## 2019-01-07 NOTE — Progress Notes (Signed)
Pulmonary Critical Care Medicine [place]   PULMONARY CRITICAL CARE SERVICE  PROGRESS NOTE  Date of Service: 01/07/2019  Bianca Willis [facility]  LKG:[id number]  DOB: 25-Jul-1950   DOA: 12/30/2018  Referring Physician: Merton Border, MD  HPI: Bianca Willis is a 68 y.o. female seen for follow up of Acute on Chronic Respiratory Failure.  Patient is comfortable right now without distress is on pressure support mode  Medications: Reviewed on Rounds  Physical Exam:  Vitals: Temperature 97.8 pulse 99 respiratory days 18 blood pressure 138/57 saturations 98%  Ventilator Settings mode ventilation pressure support FiO2 28% pressure 12 PEEP 5  . General: Comfortable at this time . Eyes: Grossly normal lids, irises & conjunctiva . ENT: grossly tongue is normal . Neck: no obvious mass . Cardiovascular: S1 S2 normal no gallop . Respiratory: No rhonchi no rales are noted at this time . Abdomen: soft . Skin: no rash seen on limited exam . Musculoskeletal: not rigid . Psychiatric:unable to assess . Neurologic: no seizure no involuntary movements         Lab Data:   Basic Metabolic Panel: Recent Labs  Lab 01/01/19 0807 01/01/19 1818 01/02/19 1033 01/03/19 0829 01/04/19 0622 01/05/19 0700 01/06/19 0620  NA 141 143 142 143 140 138 136  K 4.2 4.3 4.2 4.3 4.5 4.4 3.7  CL 105 102 102 104 106 98 96*  CO2 27 31 30 30 30  32 30  GLUCOSE 250* 251* 200* 194* 148* 177* 175*  BUN 49* 47* 46* 42* 44* 36* 39*  CREATININE 0.83 0.78 0.86 0.72 0.68 0.64 0.60  CALCIUM 9.1 8.9 8.4* 8.0* 6.6* 8.8* 9.0  MG 1.9 1.7 1.9 2.1 1.9 2.4  --   PHOS 4.2  --   --   --   --  2.6  --     ABG: Recent Labs  Lab 01/01/19 0422  PHART 7.418  PCO2ART 50.6*  PO2ART 66.6*  HCO3 32.1*  O2SAT 89.7    Liver Function Tests: Recent Labs  Lab 01/01/19 0807 01/05/19 0700  AST 29 41  ALT 42 61*  ALKPHOS 87 108  BILITOT 0.3 0.9  PROT 5.1* 5.3*  ALBUMIN 2.4* 2.5*   No results for  input(s): LIPASE, AMYLASE in the last 168 hours. No results for input(s): AMMONIA in the last 168 hours.  CBC: Recent Labs  Lab 01/01/19 0807 01/02/19 1033 01/03/19 0829 01/04/19 1033 01/05/19 0700  WBC 22.7* 24.7* 17.1* 14.4* 12.1*  NEUTROABS 18.8* 21.1* 14.4* 10.2*  --   HGB 10.6* 10.2* 8.9* 9.6* 10.3*  HCT 33.7* 32.9* 28.7* 30.1* 32.7*  MCV 86.9 86.8 87.8 85.3 86.1  PLT 409* 400 342 343 339    Cardiac Enzymes: No results for input(s): CKTOTAL, CKMB, CKMBINDEX, TROPONINI in the last 168 hours.  BNP (last 3 results) No results for input(s): BNP in the last 8760 hours.  ProBNP (last 3 results) No results for input(s): PROBNP in the last 8760 hours.  Radiological Exams: Dg Abd Portable 1v  Result Date: 01/06/2019 CLINICAL DATA:  Assess ileus. EXAM: PORTABLE ABDOMEN - 1 VIEW COMPARISON:  January 04, 2019 FINDINGS: Nasogastric tube is identified coiled in the stomach. Gaseous dilatation of the colon is significantly decreased, greatest diameter 5.4 cm. Prior cholecystectomy clips are identified in the right upper quadrant. IMPRESSION: Gaseous dilatation of the colon is significantly decreased, greatest diameter 5.4 cm. Electronically Signed   By: Abelardo Diesel M.D.   On: 01/06/2019 07:40    Assessment/Plan Active Problems:  Acute on chronic respiratory failure with hypoxia (HCC)   Cardiac arrest (HCC)   Ventilator dependent (HCC)   Endotracheally intubated   Obstructive chronic bronchitis without exacerbation (HCC)   Nonspecific abdominal pain   1. Acute on chronic respiratory failure with hypoxia we will continue with full support on pressure support the goal to wean his 24 hours today 2. Cardiac arrest rhythm is stable at this time 3. Vent dependent weaning 4. Endotracheally intubated we will continue with supportive care 5. COPD advanced disease 6. Nonspecific abdominal pain patient is at baseline no pain noted   I have personally seen and evaluated the patient,  evaluated laboratory and imaging results, formulated the assessment and plan and placed orders. The Patient requires high complexity decision making for assessment and support.  Case was discussed on Rounds with the Respiratory Therapy Staff  Yevonne PaxSaadat A Briannon Boggio, MD [facility] Pulmonary Critical Care Medicine Sleep Medicine

## 2019-01-08 DIAGNOSIS — J9621 Acute and chronic respiratory failure with hypoxia: Secondary | ICD-10-CM | POA: Diagnosis not present

## 2019-01-08 DIAGNOSIS — Z9289 Personal history of other medical treatment: Secondary | ICD-10-CM | POA: Diagnosis not present

## 2019-01-08 DIAGNOSIS — J449 Chronic obstructive pulmonary disease, unspecified: Secondary | ICD-10-CM | POA: Diagnosis not present

## 2019-01-08 DIAGNOSIS — I469 Cardiac arrest, cause unspecified: Secondary | ICD-10-CM | POA: Diagnosis not present

## 2019-01-08 LAB — COMPREHENSIVE METABOLIC PANEL
ALT: UNDETERMINED U/L (ref 0–44)
AST: UNDETERMINED U/L (ref 15–41)
Albumin: 2.4 g/dL — ABNORMAL LOW (ref 3.5–5.0)
Alkaline Phosphatase: UNDETERMINED U/L (ref 38–126)
Anion gap: 13 (ref 5–15)
BUN: 46 mg/dL — ABNORMAL HIGH (ref 8–23)
CO2: 24 mmol/L (ref 22–32)
Calcium: 9.7 mg/dL (ref 8.9–10.3)
Chloride: 99 mmol/L (ref 98–111)
Creatinine, Ser: 0.65 mg/dL (ref 0.44–1.00)
GFR calc Af Amer: >60 mL/min (ref >60)
GFR calc non Af Amer: >60 mL/min (ref >60)
Glucose, Bld: 179 mg/dL — ABNORMAL HIGH (ref 70–99)
Potassium: 4.2 mmol/L (ref 3.5–5.1)
Sodium: 136 mmol/L (ref 135–145)
Total Bilirubin: UNDETERMINED mg/dL (ref 0.3–1.2)
Total Protein: 4.8 g/dL — ABNORMAL LOW (ref 6.5–8.1)

## 2019-01-08 LAB — CBC
HCT: 32.2 % — ABNORMAL LOW (ref 36.0–46.0)
Hemoglobin: 10.2 g/dL — ABNORMAL LOW (ref 12.0–15.0)
MCH: 27.6 pg (ref 26.0–34.0)
MCHC: 31.7 g/dL (ref 30.0–36.0)
MCV: 87.3 fL (ref 80.0–100.0)
Platelets: 320 10*3/uL (ref 150–400)
RBC: 3.69 MIL/uL — ABNORMAL LOW (ref 3.87–5.11)
RDW: 15.7 % — ABNORMAL HIGH (ref 11.5–15.5)
WBC: 17.4 10*3/uL — ABNORMAL HIGH (ref 4.0–10.5)
nRBC: 0.1 % (ref 0.0–0.2)

## 2019-01-08 LAB — MAGNESIUM: Magnesium: 1.9 mg/dL (ref 1.7–2.4)

## 2019-01-08 LAB — HEPATIC FUNCTION PANEL
ALT: 68 U/L — ABNORMAL HIGH (ref 0–44)
AST: 37 U/L (ref 15–41)
Albumin: 2.5 g/dL — ABNORMAL LOW (ref 3.5–5.0)
Alkaline Phosphatase: 142 U/L — ABNORMAL HIGH (ref 38–126)
Bilirubin, Direct: 0.4 mg/dL — ABNORMAL HIGH (ref 0.0–0.2)
Indirect Bilirubin: 0.5 mg/dL (ref 0.3–0.9)
Total Bilirubin: 0.9 mg/dL (ref 0.3–1.2)
Total Protein: 5.4 g/dL — ABNORMAL LOW (ref 6.5–8.1)

## 2019-01-08 LAB — PHOSPHORUS: Phosphorus: 3.4 mg/dL (ref 2.5–4.6)

## 2019-01-08 NOTE — Progress Notes (Addendum)
Pulmonary Critical Care Medicine [place]   PULMONARY CRITICAL CARE SERVICE  PROGRESS NOTE  Date of Service: 01/08/2019  Bianca Willis [facility]  NWG:[id number]  DOB: May 27, 1951   DOA: 12/30/2018  Referring Physician: Merton Border, MD  HPI: Bianca Willis is a 68 y.o. female seen for follow up of Acute on Chronic Respiratory Failure.  Patient continue on pressure support 12/5 with an FiO2 28%.  Patient is to be orally intubated satting well with no distress.  Medications: Reviewed on Rounds  Physical Exam:  Vitals: Pulse 93 respirations 23 BP 137/84 O2 sat 97% temp 97.4  Ventilator Settings pressure support 12/5 FiO2 28%  . General: Comfortable at this time . Eyes: Grossly normal lids, irises & conjunctiva . ENT: grossly tongue is normal . Neck: no obvious mass . Cardiovascular: S1 S2 normal no gallop . Respiratory: No rales or rhonchi noted . Abdomen: soft . Skin: no rash seen on limited exam . Musculoskeletal: not rigid . Psychiatric:unable to assess . Neurologic: no seizure no involuntary movements         Lab Data:   Basic Metabolic Panel: Recent Labs  Lab 01/02/19 1033 01/03/19 0829 01/04/19 0622 01/05/19 0700 01/06/19 0620 01/08/19 0520 01/08/19 0736  NA 142 143 140 138 136 136  --   K 4.2 4.3 4.5 4.4 3.7 4.2  --   CL 102 104 106 98 96* 99  --   CO2 30 30 30  32 30 24  --   GLUCOSE 200* 194* 148* 177* 175* 179*  --   BUN 46* 42* 44* 36* 39* 46*  --   CREATININE 0.86 0.72 0.68 0.64 0.60 0.65  --   CALCIUM 8.4* 8.0* 6.6* 8.8* 9.0 9.7  --   MG 1.9 2.1 1.9 2.4  --   --  1.9  PHOS  --   --   --  2.6  --  3.4  --     ABG: No results for input(s): PHART, PCO2ART, PO2ART, HCO3, O2SAT in the last 168 hours.  Liver Function Tests: Recent Labs  Lab 01/05/19 0700 01/08/19 0520 01/08/19 0736  AST 41 QUANTITY NOT SUFFICIENT, UNABLE TO PERFORM TEST 37  ALT 61* QUANTITY NOT SUFFICIENT, UNABLE TO PERFORM TEST 68*  ALKPHOS 108 QUANTITY NOT  SUFFICIENT, UNABLE TO PERFORM TEST 142*  BILITOT 0.9 QUANTITY NOT SUFFICIENT, UNABLE TO PERFORM TEST 0.9  PROT 5.3* 4.8* 5.4*  ALBUMIN 2.5* 2.4* 2.5*   No results for input(s): LIPASE, AMYLASE in the last 168 hours. No results for input(s): AMMONIA in the last 168 hours.  CBC: Recent Labs  Lab 01/02/19 1033 01/03/19 0829 01/04/19 1033 01/05/19 0700 01/08/19 0736  WBC 24.7* 17.1* 14.4* 12.1* 17.4*  NEUTROABS 21.1* 14.4* 10.2*  --   --   HGB 10.2* 8.9* 9.6* 10.3* 10.2*  HCT 32.9* 28.7* 30.1* 32.7* 32.2*  MCV 86.8 87.8 85.3 86.1 87.3  PLT 400 342 343 339 320    Cardiac Enzymes: No results for input(s): CKTOTAL, CKMB, CKMBINDEX, TROPONINI in the last 168 hours.  BNP (last 3 results) No results for input(s): BNP in the last 8760 hours.  ProBNP (last 3 results) No results for input(s): PROBNP in the last 8760 hours.  Radiological Exams: No results found.  Assessment/Plan Active Problems:   Acute on chronic respiratory failure with hypoxia (HCC)   Cardiac arrest (HCC)   Ventilator dependent (HCC)   Endotracheally intubated   Obstructive chronic bronchitis without exacerbation (HCC)   Nonspecific abdominal pain  1. Acute on chronic respiratory failure with hypoxia continue with full support pressure support mode at this time.  Current goal is 48 hours.  Continue supportive measures at this time. 2. Cardiac arrest rhythm is stable at this time 3. Vent dependent weaning 4. Endotracheally intubated we will continue with supportive care 5. COPD advanced disease 6. Nonspecific abdominal pain patient is at baseline no pain noted   I have personally seen and evaluated the patient, evaluated laboratory and imaging results, formulated the assessment and plan and placed orders. The Patient requires high complexity decision making for assessment and support.  Case was discussed on Rounds with the Respiratory Therapy Staff  Yevonne PaxSaadat A Miryah Ralls, MD [facility] Pulmonary Critical Care  Medicine Sleep Medicine

## 2019-01-09 ENCOUNTER — Other Ambulatory Visit (HOSPITAL_COMMUNITY): Payer: Medicare Other

## 2019-01-09 DIAGNOSIS — J449 Chronic obstructive pulmonary disease, unspecified: Secondary | ICD-10-CM | POA: Diagnosis not present

## 2019-01-09 DIAGNOSIS — Z9289 Personal history of other medical treatment: Secondary | ICD-10-CM | POA: Diagnosis not present

## 2019-01-09 DIAGNOSIS — I469 Cardiac arrest, cause unspecified: Secondary | ICD-10-CM | POA: Diagnosis not present

## 2019-01-09 DIAGNOSIS — J9621 Acute and chronic respiratory failure with hypoxia: Secondary | ICD-10-CM | POA: Diagnosis not present

## 2019-01-09 LAB — BASIC METABOLIC PANEL
Anion gap: 9 (ref 5–15)
BUN: 52 mg/dL — ABNORMAL HIGH (ref 8–23)
CO2: 30 mmol/L (ref 22–32)
Calcium: 9.8 mg/dL (ref 8.9–10.3)
Chloride: 97 mmol/L — ABNORMAL LOW (ref 98–111)
Creatinine, Ser: 0.71 mg/dL (ref 0.44–1.00)
GFR calc Af Amer: >60 mL/min (ref >60)
GFR calc non Af Amer: >60 mL/min (ref >60)
Glucose, Bld: 143 mg/dL — ABNORMAL HIGH (ref 70–99)
Potassium: 3.4 mmol/L — ABNORMAL LOW (ref 3.5–5.1)
Sodium: 136 mmol/L (ref 135–145)

## 2019-01-09 LAB — TRIGLYCERIDES: Triglycerides: 153 mg/dL — ABNORMAL HIGH (ref <150)

## 2019-01-09 MED ORDER — GENERIC EXTERNAL MEDICATION
Status: DC
Start: ? — End: 2019-01-09

## 2019-01-09 NOTE — Progress Notes (Addendum)
[facility]   PULMONARY CRITICAL CARE SERVICE  PROGRESS NOTE  Date of Service: 01/09/2019  Bianca Willis  ZOX:[id number]  DOB: 07/18/1950   DOA: 12/30/2018  Referring Physician: Carron CurieAli Hijazi, MD  HPI: Bianca Willis is a 68 y.o. female seen for follow up of Acute on Chronic Respiratory Failure.  Patient remains orally intubated pressure support 12/5 FiO2 28% currently satting well no fever or distress noted.  Medications: Reviewed on Rounds  Physical Exam:  Vitals: Pulse 94 respirations 18 BP 139/71 O2 sat 97% temp 94.8  Ventilator Settings pressure for 12/5 FiO2 28%  . General: Comfortable at this time . Eyes: Grossly normal lids, irises & conjunctiva . ENT: grossly tongue is normal . Neck: no obvious mass . Cardiovascular: S1 S2 normal no gallop . Respiratory: No rales or rhonchi noted . Abdomen: soft . Skin: no rash seen on limited exam . Musculoskeletal: not rigid . Psychiatric:unable to assess . Neurologic: no seizure no involuntary movements         Lab Data:   Basic Metabolic Panel: Recent Labs  Lab 01/03/19 0829 01/04/19 0622 01/05/19 0700 01/06/19 0620 01/08/19 0520 01/08/19 0736 01/09/19 0530  NA 143 140 138 136 136  --  136  K 4.3 4.5 4.4 3.7 4.2  --  3.4*  CL 104 106 98 96* 99  --  97*  CO2 30 30 32 30 24  --  30  GLUCOSE 194* 148* 177* 175* 179*  --  143*  BUN 42* 44* 36* 39* 46*  --  52*  CREATININE 0.72 0.68 0.64 0.60 0.65  --  0.71  CALCIUM 8.0* 6.6* 8.8* 9.0 9.7  --  9.8  MG 2.1 1.9 2.4  --   --  1.9  --   PHOS  --   --  2.6  --  3.4  --   --     ABG: No results for input(s): PHART, PCO2ART, PO2ART, HCO3, O2SAT in the last 168 hours.  Liver Function Tests: Recent Labs  Lab 01/05/19 0700 01/08/19 0520 01/08/19 0736  AST 41 QUANTITY NOT SUFFICIENT, UNABLE TO PERFORM TEST 37  ALT 61* QUANTITY NOT SUFFICIENT, UNABLE TO PERFORM TEST 68*  ALKPHOS 108 QUANTITY NOT SUFFICIENT,  UNABLE TO PERFORM TEST 142*  BILITOT 0.9 QUANTITY NOT SUFFICIENT, UNABLE TO PERFORM TEST 0.9  PROT 5.3* 4.8* 5.4*  ALBUMIN 2.5* 2.4* 2.5*   No results for input(s): LIPASE, AMYLASE in the last 168 hours. No results for input(s): AMMONIA in the last 168 hours.  CBC: Recent Labs  Lab 01/03/19 0829 01/04/19 1033 01/05/19 0700 01/08/19 0736  WBC 17.1* 14.4* 12.1* 17.4*  NEUTROABS 14.4* 10.2*  --   --   HGB 8.9* 9.6* 10.3* 10.2*  HCT 28.7* 30.1* 32.7* 32.2*  MCV 87.8 85.3 86.1 87.3  PLT 342 343 339 320    Cardiac Enzymes: No results for input(s): CKTOTAL, CKMB, CKMBINDEX, TROPONINI in the last 168 hours.  BNP (last 3 results) No results for input(s): BNP in the last 8760 hours.  ProBNP (last 3 results) No results for input(s): PROBNP in the last 8760 hours.  Radiological Exams: Dg Abd 1 View  Result Date: 01/09/2019 CLINICAL DATA:  Ileus. EXAM: ABDOMEN - 1 VIEW COMPARISON:  Radiographs of January 06, 2019. FINDINGS: The bowel gas pattern is normal. Distal tip of nasogastric tube is seen in the stomach. No radio-opaque calculi or other significant radiographic abnormality are seen. IMPRESSION: Distal tip of nasogastric  tube seen in the stomach. No definite evidence of bowel obstruction or ileus. Electronically Signed   By: Marijo Conception M.D.   On: 01/09/2019 08:38    Assessment/Plan Active Problems:   Acute on chronic respiratory failure with hypoxia (HCC)   Cardiac arrest (HCC)   Ventilator dependent (HCC)   Endotracheally intubated   Obstructive chronic bronchitis without exacerbation (HCC)   Nonspecific abdominal pain   1. Acute on chronic respiratory failure with hypoxia continue with full support pressure support mode at this time.  Continue supportive measures at this time. 2. Cardiac arrest rhythm is stable at this time 3. Vent dependent weaning 4. Endotracheally intubated we will continue with supportive care 5. COPD advanced disease 6. Nonspecific abdominal pain  patient is at baseline no pain noted   I have personally seen and evaluated the patient, evaluated laboratory and imaging results, formulated the assessment and plan and placed orders. The Patient requires high complexity decision making for assessment and support.  Case was discussed on Rounds with the Respiratory Therapy Staff  Allyne Gee, MD [facility] Pulmonary Critical Care Medicine Sleep Medicine

## 2019-01-10 ENCOUNTER — Other Ambulatory Visit (HOSPITAL_COMMUNITY): Payer: Medicare Other

## 2019-01-10 DIAGNOSIS — I469 Cardiac arrest, cause unspecified: Secondary | ICD-10-CM | POA: Diagnosis not present

## 2019-01-10 DIAGNOSIS — Z9289 Personal history of other medical treatment: Secondary | ICD-10-CM | POA: Diagnosis not present

## 2019-01-10 DIAGNOSIS — J449 Chronic obstructive pulmonary disease, unspecified: Secondary | ICD-10-CM | POA: Diagnosis not present

## 2019-01-10 DIAGNOSIS — J9621 Acute and chronic respiratory failure with hypoxia: Secondary | ICD-10-CM | POA: Diagnosis not present

## 2019-01-10 LAB — BASIC METABOLIC PANEL
Anion gap: 10 (ref 5–15)
BUN: 44 mg/dL — ABNORMAL HIGH (ref 8–23)
CO2: 32 mmol/L (ref 22–32)
Calcium: 9.4 mg/dL (ref 8.9–10.3)
Chloride: 97 mmol/L — ABNORMAL LOW (ref 98–111)
Creatinine, Ser: 0.66 mg/dL (ref 0.44–1.00)
GFR calc Af Amer: >60 mL/min (ref >60)
GFR calc non Af Amer: >60 mL/min (ref >60)
Glucose, Bld: 165 mg/dL — ABNORMAL HIGH (ref 70–99)
Potassium: 3.5 mmol/L (ref 3.5–5.1)
Sodium: 139 mmol/L (ref 135–145)

## 2019-01-10 LAB — CBC
HCT: 29.6 % — ABNORMAL LOW (ref 36.0–46.0)
Hemoglobin: 9.4 g/dL — ABNORMAL LOW (ref 12.0–15.0)
MCH: 27.5 pg (ref 26.0–34.0)
MCHC: 31.8 g/dL (ref 30.0–36.0)
MCV: 86.5 fL (ref 80.0–100.0)
Platelets: 282 10*3/uL (ref 150–400)
RBC: 3.42 MIL/uL — ABNORMAL LOW (ref 3.87–5.11)
RDW: 16.3 % — ABNORMAL HIGH (ref 11.5–15.5)
WBC: 11.8 10*3/uL — ABNORMAL HIGH (ref 4.0–10.5)
nRBC: 0 % (ref 0.0–0.2)

## 2019-01-10 LAB — MAGNESIUM: Magnesium: 2.2 mg/dL (ref 1.7–2.4)

## 2019-01-10 MED ORDER — GENERIC EXTERNAL MEDICATION
Status: DC
Start: ? — End: 2019-01-10

## 2019-01-10 NOTE — Progress Notes (Signed)
Pulmonary Critical Care Medicine [facility]   PULMONARY CRITICAL CARE SERVICE  PROGRESS NOTE  [facility] 01/10[facility]  Bianca Willis  ZOX:[id number]  DOB: 09/23/1950   DOA: 12/30/2018  Referring Physician: Carron CurieAli Hijazi, MD  HPI: Bianca PieriniJudy Galloway Willis is a 68 y.o. female seen for follow up of Acute on Chronic Respiratory Failure.  Patient is on pressure support mode at this time is been on 28% FiO2 and tolerating the pressure support for quite some time.  We did a cuff leak test which patient did not pass so we are holding off on extubation.  Medications: Reviewed on Rounds  Physical Exam:  Vitals: Temperature 97.4 pulse is 107 respiratory rate 27 blood pressure 162/79 saturations 100%  Ventilator Settings mode ventilation pressure support FiO2 28% tidal volume 4 6 pressure poor 12 PEEP 5  . General: Comfortable at this time . Eyes: Grossly normal lids, irises & conjunctiva . ENT: grossly tongue is normal . Neck: no obvious mass . Cardiovascular: S1 S2 normal no gallop . Respiratory: No rhonchi no rales are noted at this time . Abdomen: soft . Skin: no rash seen on limited exam . Musculoskeletal: not rigid . Psychiatric:unable to assess . Neurologic: no seizure no involuntary movements         Lab Data:   Basic Metabolic Panel: Recent Labs  Lab 01/04/19 0622 01/05/19 0700 01/06/19 0620 01/08/19 0520 01/08/19 0736 01/09/19 0530 01/10/19 0716  NA 140 138 136 136  --  136 139  K 4.5 4.4 3.7 4.2  --  3.4* 3.5  CL 106 98 96* 99  --  97* 97*  CO2 30 32 30 24  --  30 32  GLUCOSE 148* 177* 175* 179*  --  143* 165*  BUN 44* 36* 39* 46*  --  52* 44*  CREATININE 0.68 0.64 0.60 0.65  --  0.71 0.66  CALCIUM 6.6* 8.8* 9.0 9.7  --  9.8 9.4  MG 1.9 2.4  --   --  1.9  --  2.2  PHOS  --  2.6  --  3.4  --   --   --     ABG: No results for input(s): PHART, PCO2ART, PO2ART, HCO3, O2SAT in the last 168 hours.  Liver Function Tests: Recent Labs  Lab  01/05/19 0700 01/08/19 0520 01/08/19 0736  AST 41 QUANTITY NOT SUFFICIENT, UNABLE TO PERFORM TEST 37  ALT 61* QUANTITY NOT SUFFICIENT, UNABLE TO PERFORM TEST 68*  ALKPHOS 108 QUANTITY NOT SUFFICIENT, UNABLE TO PERFORM TEST 142*  BILITOT 0.9 QUANTITY NOT SUFFICIENT, UNABLE TO PERFORM TEST 0.9  PROT 5.3* 4.8* 5.4*  ALBUMIN 2.5* 2.4* 2.5*   No results for input(s): LIPASE, AMYLASE in the last 168 hours. No results for input(s): AMMONIA in the last 168 hours.  CBC: Recent Labs  Lab 01/04/19 1033 01/05/19 0700 01/08/19 0736 01/10/19 0829  WBC 14.4* 12.1* 17.4* 11.8*  NEUTROABS 10.2*  --   --   --   HGB 9.6* 10.3* 10.2* 9.4*  HCT 30.1* 32.7* 32.2* 29.6*  MCV 85.3 86.1 87.3 86.5  PLT 343 339 320 282    Cardiac Enzymes: No results for input(s): CKTOTAL, CKMB, CKMBINDEX, TROPONINI in the last 168 hours.  BNP (last 3 results) No results for input(s): BNP in the last 8760 hours.  ProBNP (last 3 results) No results for input(s): PROBNP in the last 8760 hours.  Radiological Exams: Dg Abd 1 View  Result Date: 01/09/2019 CLINICAL DATA:  Ileus. EXAM:  ABDOMEN - 1 VIEW COMPARISON:  Radiographs of January 06, 2019. FINDINGS: The bowel gas pattern is normal. Distal tip of nasogastric tube is seen in the stomach. No radio-opaque calculi or other significant radiographic abnormality are seen. IMPRESSION: Distal tip of nasogastric tube seen in the stomach. No definite evidence of bowel obstruction or ileus. Electronically Signed   By: Marijo Conception M.D.   On: 01/09/2019 08:38   Dg Chest Port 1 View  Result Date: 01/10/2019 CLINICAL DATA:  Pneumonia. EXAM: PORTABLE CHEST 1 VIEW COMPARISON:  01/01/2019. FINDINGS: Tracheostomy tube and NG tube in stable position. Cardiomegaly. Normal pulmonary vascularity. No focal infiltrate. No pleural effusion or pneumothorax. Old bilateral rib fractures. IMPRESSION: 1.  Lines and tubes stable position. 2.  Cardiomegaly.  No pulmonary venous congestion.  Electronically Signed   By: Marcello Moores  Register   On: 01/10/2019 06:59    Assessment/Plan Active Problems:   Acute on chronic respiratory failure with hypoxia (HCC)   Cardiac arrest (HCC)   Ventilator dependent (HCC)   Endotracheally intubated   Obstructive chronic bronchitis without exacerbation (HCC)   Nonspecific abdominal pain   1. Acute on chronic respiratory failure with hypoxia the plan was to extubate however she did not past cuff leak test.  We will give her some steroids for at least 4 doses and then recheck tomorrow 2. Cardiac arrest rhythm is stable we will continue present management 3. Vent dependent as above weaning towards wean and extubation 4. Endotracheally intubated remains on the ventilator 5. COPD at baseline continue present management 6. Nonspecific abdominal pain supportive care   I have personally seen and evaluated the patient, evaluated laboratory and imaging results, formulated the assessment and plan and placed orders. The Patient requires high complexity decision making for assessment and support.  Case was discussed on Rounds with the Respiratory Therapy Staff  Allyne Gee, MD [facility] Pulmonary Critical Care Medicine Sleep Medicine

## 2019-01-11 DIAGNOSIS — J449 Chronic obstructive pulmonary disease, unspecified: Secondary | ICD-10-CM | POA: Diagnosis not present

## 2019-01-11 DIAGNOSIS — J9621 Acute and chronic respiratory failure with hypoxia: Secondary | ICD-10-CM | POA: Diagnosis not present

## 2019-01-11 DIAGNOSIS — I469 Cardiac arrest, cause unspecified: Secondary | ICD-10-CM | POA: Diagnosis not present

## 2019-01-11 DIAGNOSIS — Z9289 Personal history of other medical treatment: Secondary | ICD-10-CM | POA: Diagnosis not present

## 2019-01-11 LAB — COMPREHENSIVE METABOLIC PANEL
ALT: 50 U/L — ABNORMAL HIGH (ref 0–44)
AST: 18 U/L (ref 15–41)
Albumin: 2.8 g/dL — ABNORMAL LOW (ref 3.5–5.0)
Alkaline Phosphatase: 171 U/L — ABNORMAL HIGH (ref 38–126)
Anion gap: 12 (ref 5–15)
BUN: 41 mg/dL — ABNORMAL HIGH (ref 8–23)
CO2: 29 mmol/L (ref 22–32)
Calcium: 9.3 mg/dL (ref 8.9–10.3)
Chloride: 97 mmol/L — ABNORMAL LOW (ref 98–111)
Creatinine, Ser: 0.73 mg/dL (ref 0.44–1.00)
GFR calc Af Amer: >60 mL/min (ref >60)
GFR calc non Af Amer: >60 mL/min (ref >60)
Glucose, Bld: 212 mg/dL — ABNORMAL HIGH (ref 70–99)
Potassium: 4 mmol/L (ref 3.5–5.1)
Sodium: 138 mmol/L (ref 135–145)
Total Bilirubin: 0.6 mg/dL (ref 0.3–1.2)
Total Protein: 5.8 g/dL — ABNORMAL LOW (ref 6.5–8.1)

## 2019-01-11 LAB — PHOSPHORUS: Phosphorus: 2.9 mg/dL (ref 2.5–4.6)

## 2019-01-11 LAB — MAGNESIUM: Magnesium: 2.4 mg/dL (ref 1.7–2.4)

## 2019-01-11 NOTE — Progress Notes (Addendum)
[facility]   PULMONARY CRITICAL CARE SERVICE  PROGRESS NOTE  Date of Service: 01/11/2019  Bianca Willis  ZOX:[id number]  DOB: 03/06/1951   DOA: 12/30/2018  Referring Physician: Carron CurieAli Hijazi, MD  HPI: Bianca Willis is a 68 y.o. female seen for follow up of Acute on Chronic Respiratory Failure.  Patient remains orally intubated on pressure support 12/5 with FiO2 of 28%.  Medications: Reviewed on Rounds  Physical Exam:  Vitals: Pulse 84 respirations 14 BP 158/77 O2 sat 100% temp 99.5  Ventilator Settings pressure support 12/5 FiO2 28%  . General: Comfortable at this time . Eyes: Grossly normal lids, irises & conjunctiva . ENT: grossly tongue is normal . Neck: no obvious mass . Cardiovascular: S1 S2 normal no gallop . Respiratory: No rales or rhonchi noted . Abdomen: soft . Skin: no rash seen on limited exam . Musculoskeletal: not rigid . Psychiatric:unable to assess . Neurologic: no seizure no involuntary movements         Lab Data:   Basic Metabolic Panel: Recent Labs  Lab 01/05/19 0700 01/06/19 0620 01/08/19 0520 01/08/19 0736 01/09/19 0530 01/10/19 0716 01/11/19 0521  NA 138 136 136  --  136 139 138  K 4.4 3.7 4.2  --  3.4* 3.5 4.0  CL 98 96* 99  --  97* 97* 97*  CO2 32 30 24  --  30 32 29  GLUCOSE 177* 175* 179*  --  143* 165* 212*  BUN 36* 39* 46*  --  52* 44* 41*  CREATININE 0.64 0.60 0.65  --  0.71 0.66 0.73  CALCIUM 8.8* 9.0 9.7  --  9.8 9.4 9.3  MG 2.4  --   --  1.9  --  2.2 2.4  PHOS 2.6  --  3.4  --   --   --  2.9    ABG: No results for input(s): PHART, PCO2ART, PO2ART, HCO3, O2SAT in the last 168 hours.  Liver Function Tests: Recent Labs  Lab 01/05/19 0700 01/08/19 0520 01/08/19 0736 01/11/19 0521  AST 41 QUANTITY NOT SUFFICIENT, UNABLE TO PERFORM TEST 37 18  ALT 61* QUANTITY NOT SUFFICIENT, UNABLE TO PERFORM TEST 68* 50*  ALKPHOS 108 QUANTITY NOT SUFFICIENT, UNABLE TO  PERFORM TEST 142* 171*  BILITOT 0.9 QUANTITY NOT SUFFICIENT, UNABLE TO PERFORM TEST 0.9 0.6  PROT 5.3* 4.8* 5.4* 5.8*  ALBUMIN 2.5* 2.4* 2.5* 2.8*   No results for input(s): LIPASE, AMYLASE in the last 168 hours. No results for input(s): AMMONIA in the last 168 hours.  CBC: Recent Labs  Lab 01/05/19 0700 01/08/19 0736 01/10/19 0829  WBC 12.1* 17.4* 11.8*  HGB 10.3* 10.2* 9.4*  HCT 32.7* 32.2* 29.6*  MCV 86.1 87.3 86.5  PLT 339 320 282    Cardiac Enzymes: No results for input(s): CKTOTAL, CKMB, CKMBINDEX, TROPONINI in the last 168 hours.  BNP (last 3 results) No results for input(s): BNP in the last 8760 hours.  ProBNP (last 3 results) No results for input(s): PROBNP in the last 8760 hours.  Radiological Exams: Dg Abd 1 View  Result Date: 01/10/2019 CLINICAL DATA:  Ileus EXAM: ABDOMEN - 1 VIEW COMPARISON:  Yesterday FINDINGS: Generalized mild gaseous distension of bowel. Moderate gaseous distention of the stomach nasogastric tube at the body. Mild atelectasis at the right lung base. No concerning mass effect or gas collection. IMPRESSION: Mild ileus pattern without significant change from yesterday. Electronically Signed   By: Kathrynn DuckingJonathon  Watts M.D.  On: 01/10/2019 17:26   Dg Chest Port 1 View  Result Date: 01/10/2019 CLINICAL DATA:  Pneumonia. EXAM: PORTABLE CHEST 1 VIEW COMPARISON:  01/01/2019. FINDINGS: Tracheostomy tube and NG tube in stable position. Cardiomegaly. Normal pulmonary vascularity. No focal infiltrate. No pleural effusion or pneumothorax. Old bilateral rib fractures. IMPRESSION: 1.  Lines and tubes stable position. 2.  Cardiomegaly.  No pulmonary venous congestion. Electronically Signed   By: Marcello Moores  Register   On: 01/10/2019 06:59    Assessment/Plan Active Problems:   Acute on chronic respiratory failure with hypoxia (HCC)   Cardiac arrest (HCC)   Ventilator dependent (HCC)   Endotracheally intubated   Obstructive chronic bronchitis without exacerbation  (HCC)   Nonspecific abdominal pain   1. Acute on chronic respiratory failure with hypoxia patient continues to not have cuff leak today we will continue steroids and try again tomorrow.  Continue supportive measures. 2. Cardiac arrest rhythm is stable we will continue present management 3. Vent dependent as above weaning towards wean and extubation 4. Endotracheally intubated remains on the ventilator 5. COPD at baseline continue present management 6. Nonspecific abdominal pain supportive care   I have personally seen and evaluated the patient, evaluated laboratory and imaging results, formulated the assessment and plan and placed orders. The Patient requires high complexity decision making for assessment and support.  Case was discussed on Rounds with the Respiratory Therapy Staff  Allyne Gee, MD [facility] Pulmonary Critical Care Medicine Sleep Medicine

## 2019-01-12 DIAGNOSIS — J449 Chronic obstructive pulmonary disease, unspecified: Secondary | ICD-10-CM | POA: Diagnosis not present

## 2019-01-12 DIAGNOSIS — J9621 Acute and chronic respiratory failure with hypoxia: Secondary | ICD-10-CM | POA: Diagnosis not present

## 2019-01-12 DIAGNOSIS — I469 Cardiac arrest, cause unspecified: Secondary | ICD-10-CM | POA: Diagnosis not present

## 2019-01-12 DIAGNOSIS — Z9289 Personal history of other medical treatment: Secondary | ICD-10-CM | POA: Diagnosis not present

## 2019-01-12 LAB — BASIC METABOLIC PANEL
Anion gap: 10 (ref 5–15)
BUN: 44 mg/dL — ABNORMAL HIGH (ref 8–23)
CO2: 30 mmol/L (ref 22–32)
Calcium: 8.7 mg/dL — ABNORMAL LOW (ref 8.9–10.3)
Chloride: 97 mmol/L — ABNORMAL LOW (ref 98–111)
Creatinine, Ser: 0.67 mg/dL (ref 0.44–1.00)
GFR calc Af Amer: >60 mL/min (ref >60)
GFR calc non Af Amer: >60 mL/min (ref >60)
Glucose, Bld: 138 mg/dL — ABNORMAL HIGH (ref 70–99)
Potassium: 3 mmol/L — ABNORMAL LOW (ref 3.5–5.1)
Sodium: 137 mmol/L (ref 135–145)

## 2019-01-12 LAB — TRIGLYCERIDES: Triglycerides: 80 mg/dL (ref <150)

## 2019-01-12 NOTE — Progress Notes (Addendum)
[facility]   PULMONARY CRITICAL CARE SERVICE  PROGRESS NOTE  Date of Service: 01/12/2019  [name] [facility]  ZOX:[id number]  DOB: 07/05/1951   DOA: 12/30/2018  Referring Physician: Carron CurieAli Hijazi, MD  HPI: Jadene PieriniJudy Galloway [place] is a 68 y.o. female seen for follow up of Acute on Chronic Respiratory Failure.  Patient continues to be orally intubated on steroids.  Patient does not have a cuff leak still at this time.  Continues on pressure support 12/5 FiO2 28%.  Medications: Reviewed on Rounds  Physical Exam:  Vitals: Pulse 105 respirations 18 BP 142/85 O2 sat 93% 7.7  Ventilator Settings were 12/5 FiO2 20%  . General: Comfortable at this time . Eyes: Grossly normal lids, irises & conjunctiva . ENT: grossly tongue is normal . Neck: no obvious mass . Cardiovascular: S1 S2 normal no gallop . Respiratory: No rales or rhonchi noted . Abdomen: soft . Skin: no rash seen on limited exam . Musculoskeletal: not rigid . Psychiatric:unable to assess . Neurologic: no seizure no involuntary movements         Lab Data:   Basic Metabolic Panel: Recent Labs  Lab 01/08/19 0520 01/08/19 0736 01/09/19 0530 01/10/19 0716 01/11/19 0521 01/12/19 0737  NA 136  --  136 139 138 137  K 4.2  --  3.4* 3.5 4.0 3.0*  CL 99  --  97* 97* 97* 97*  CO2 24  --  30 32 29 30  GLUCOSE 179*  --  143* 165* 212* 138*  BUN 46*  --  52* 44* 41* 44*  CREATININE 0.65  --  0.71 0.66 0.73 0.67  CALCIUM 9.7  --  9.8 9.4 9.3 8.7*  MG  --  1.9  --  2.2 2.4  --   PHOS 3.4  --   --   --  2.9  --     ABG: No results for input(s): PHART, PCO2ART, PO2ART, HCO3, O2SAT in the last 168 hours.  Liver Function Tests: Recent Labs  Lab 01/08/19 0520 01/08/19 0736 01/11/19 0521  AST QUANTITY NOT SUFFICIENT, UNABLE TO PERFORM TEST 37 18  ALT QUANTITY NOT SUFFICIENT, UNABLE TO PERFORM TEST 68* 50*  ALKPHOS QUANTITY NOT SUFFICIENT, UNABLE TO PERFORM TEST 142* 171*   BILITOT QUANTITY NOT SUFFICIENT, UNABLE TO PERFORM TEST 0.9 0.6  PROT 4.8* 5.4* 5.8*  ALBUMIN 2.4* 2.5* 2.8*   No results for input(s): LIPASE, AMYLASE in the last 168 hours. No results for input(s): AMMONIA in the last 168 hours.  CBC: Recent Labs  Lab 01/08/19 0736 01/10/19 0829  WBC 17.4* 11.8*  HGB 10.2* 9.4*  HCT 32.2* 29.6*  MCV 87.3 86.5  PLT 320 282    Cardiac Enzymes: No results for input(s): CKTOTAL, CKMB, CKMBINDEX, TROPONINI in the last 168 hours.  BNP (last 3 results) No results for input(s): BNP in the last 8760 hours.  ProBNP (last 3 results) No results for input(s): PROBNP in the last 8760 hours.  Radiological Exams: Dg Abd 1 View  Result Date: 01/10/2019 CLINICAL DATA:  Ileus EXAM: ABDOMEN - 1 VIEW COMPARISON:  Yesterday FINDINGS: Generalized mild gaseous distension of bowel. Moderate gaseous distention of the stomach nasogastric tube at the body. Mild atelectasis at the right lung base. No concerning mass effect or gas collection. IMPRESSION: Mild ileus pattern without significant change from yesterday. Electronically Signed   By: Marnee SpringJonathon  Watts M.D.   On: 01/10/2019 17:26    Assessment/Plan Active Problems:   Acute on  chronic respiratory failure with hypoxia (HCC)   Cardiac arrest ([place])   Ventilator dependent ([place])   Endotracheally intubated   Obstructive chronic bronchitis without exacerbation (HCC)   Nonspecific abdominal pain   1. Acute on chronic respiratory failure with hypoxia patient continues to not have cuff leak today we will continue steroids and try again tomorrow.  Continue supportive measures. 2. Cardiac arrest rhythm is stable we will continue present management 3. Vent dependent as above weaning towards wean and extubation 4. Endotracheally intubated remains on the ventilator 5. COPD at baseline continue present management 6. Nonspecific abdominal pain supportive care   I have personally seen and evaluated the patient, evaluated  laboratory and imaging results, formulated the assessment and plan and placed orders. The Patient requires high complexity decision making for assessment and support.  Case was discussed on Rounds with the Respiratory Therapy Staff  Allyne Gee, MD [facility] Pulmonary Critical Care Medicine Sleep Medicine

## 2019-01-13 DIAGNOSIS — I469 Cardiac arrest, cause unspecified: Secondary | ICD-10-CM | POA: Diagnosis not present

## 2019-01-13 DIAGNOSIS — R109 Unspecified abdominal pain: Secondary | ICD-10-CM | POA: Diagnosis not present

## 2019-01-13 DIAGNOSIS — J9621 Acute and chronic respiratory failure with hypoxia: Secondary | ICD-10-CM | POA: Diagnosis not present

## 2019-01-13 DIAGNOSIS — J449 Chronic obstructive pulmonary disease, unspecified: Secondary | ICD-10-CM | POA: Diagnosis not present

## 2019-01-13 LAB — BASIC METABOLIC PANEL
Anion gap: 11 (ref 5–15)
BUN: 30 mg/dL — ABNORMAL HIGH (ref 8–23)
CO2: 30 mmol/L (ref 22–32)
Calcium: 8.5 mg/dL — ABNORMAL LOW (ref 8.9–10.3)
Chloride: 98 mmol/L (ref 98–111)
Creatinine, Ser: 0.52 mg/dL (ref 0.44–1.00)
GFR calc Af Amer: >60 mL/min (ref >60)
GFR calc non Af Amer: >60 mL/min (ref >60)
Glucose, Bld: 100 mg/dL — ABNORMAL HIGH (ref 70–99)
Potassium: 3.4 mmol/L — ABNORMAL LOW (ref 3.5–5.1)
Sodium: 139 mmol/L (ref 135–145)

## 2019-01-13 LAB — CBC
HCT: 30.2 % — ABNORMAL LOW (ref 36.0–46.0)
Hemoglobin: 9.4 g/dL — ABNORMAL LOW (ref 12.0–15.0)
MCH: 27.6 pg (ref 26.0–34.0)
MCHC: 31.1 g/dL (ref 30.0–36.0)
MCV: 88.8 fL (ref 80.0–100.0)
Platelets: 281 10*3/uL (ref 150–400)
RBC: 3.4 MIL/uL — ABNORMAL LOW (ref 3.87–5.11)
RDW: 17 % — ABNORMAL HIGH (ref 11.5–15.5)
WBC: 8.3 10*3/uL (ref 4.0–10.5)
nRBC: 0 % (ref 0.0–0.2)

## 2019-01-13 LAB — MAGNESIUM: Magnesium: 2.3 mg/dL (ref 1.7–2.4)

## 2019-01-13 LAB — PHOSPHORUS: Phosphorus: 2.3 mg/dL — ABNORMAL LOW (ref 2.5–4.6)

## 2019-01-13 NOTE — Progress Notes (Signed)
Pulmonary Critical Care Medicine [place]   PULMONARY CRITICAL CARE SERVICE  PROGRESS NOTE  Date of Service: 01/13/2019  Edla Para [facility]  VOJ:[id number]  DOB: 16-Jan-1951   DOA: 12/30/2018  Referring Physician: Merton Border, MD  HPI: Bianca Willis is a 68 y.o. female seen for follow up of Acute on Chronic Respiratory Failure.  Patient currently is extubated was on the BiPAP from yesterday was actually done quite well no significant desaturations are noted  Medications: Reviewed on Rounds  Physical Exam:  Vitals: Temperature 96.5 pulse 73 respiratory rate 16 blood pressure 168/86 saturations were 99%  Ventilator Settings patient was on BiPAP which Willis be discontinued after 24 hours total  . General: Comfortable at this time . Eyes: Grossly normal lids, irises & conjunctiva . ENT: grossly tongue is normal . Neck: no obvious mass . Cardiovascular: S1 S2 normal no gallop . Respiratory: No rhonchi no rales are noted at this time . Abdomen: soft . Skin: no rash seen on limited exam . Musculoskeletal: not rigid . Psychiatric:unable to assess . Neurologic: no seizure no involuntary movements         Lab Data:   Basic Metabolic Panel: Recent Labs  Lab 01/08/19 0520 01/08/19 0736 01/09/19 0530 01/10/19 0716 01/11/19 0521 01/12/19 0737 01/13/19 0723  NA 136  --  136 139 138 137 139  K 4.2  --  3.4* 3.5 4.0 3.0* 3.4*  CL 99  --  97* 97* 97* 97* 98  CO2 24  --  30 32 29 30 30   GLUCOSE 179*  --  143* 165* 212* 138* 100*  BUN 46*  --  52* 44* 41* 44* 30*  CREATININE 0.65  --  0.71 0.66 0.73 0.67 0.52  CALCIUM 9.7  --  9.8 9.4 9.3 8.7* 8.5*  MG  --  1.9  --  2.2 2.4  --  2.3  PHOS 3.4  --   --   --  2.9  --  2.3*    ABG: No results for input(s): PHART, PCO2ART, PO2ART, HCO3, O2SAT in the last 168 hours.  Liver Function Tests: Recent Labs  Lab 01/08/19 0520 01/08/19 0736 01/11/19 0521  AST QUANTITY NOT SUFFICIENT, UNABLE TO PERFORM TEST  37 18  ALT QUANTITY NOT SUFFICIENT, UNABLE TO PERFORM TEST 68* 50*  ALKPHOS QUANTITY NOT SUFFICIENT, UNABLE TO PERFORM TEST 142* 171*  BILITOT QUANTITY NOT SUFFICIENT, UNABLE TO PERFORM TEST 0.9 0.6  PROT 4.8* 5.4* 5.8*  ALBUMIN 2.4* 2.5* 2.8*   No results for input(s): LIPASE, AMYLASE in the last 168 hours. No results for input(s): AMMONIA in the last 168 hours.  CBC: Recent Labs  Lab 01/08/19 0736 01/10/19 0829 01/13/19 0723  WBC 17.4* 11.8* 8.3  HGB 10.2* 9.4* 9.4*  HCT 32.2* 29.6* 30.2*  MCV 87.3 86.5 88.8  PLT 320 282 281    Cardiac Enzymes: No results for input(s): CKTOTAL, CKMB, CKMBINDEX, TROPONINI in the last 168 hours.  BNP (last 3 results) No results for input(s): BNP in the last 8760 hours.  ProBNP (last 3 results) No results for input(s): PROBNP in the last 8760 hours.  Radiological Exams: No results found.  Assessment/Plan Active Problems:   Acute on chronic respiratory failure with hypoxia (HCC)   Cardiac arrest (HCC)   Ventilator dependent (HCC)   Endotracheally intubated   Obstructive chronic bronchitis without exacerbation (HCC)   Nonspecific abdominal pain   1. Acute on chronic respiratory failure with hypoxia we Willis continue with supportive  care titrate off BiPAP this afternoon 2. Cardiac arrest rhythm is stable we Willis continue with supportive care 3. Ventilator dependence patient has been liberated from the ventilator 4. COPD severe disease we Willis continue with present management 5. Specific abdominal pain resolved   I have personally seen and evaluated the patient, evaluated laboratory and imaging results, formulated the assessment and plan and placed orders. The Patient requires high complexity decision making for assessment and support.  Case was discussed on Rounds with the Respiratory Therapy Staff  Yevonne PaxSaadat A Khan, MD [facility] Pulmonary Critical Care Medicine Sleep Medicine

## 2019-01-14 DIAGNOSIS — J9621 Acute and chronic respiratory failure with hypoxia: Secondary | ICD-10-CM | POA: Diagnosis not present

## 2019-01-14 DIAGNOSIS — R109 Unspecified abdominal pain: Secondary | ICD-10-CM | POA: Diagnosis not present

## 2019-01-14 DIAGNOSIS — J449 Chronic obstructive pulmonary disease, unspecified: Secondary | ICD-10-CM | POA: Diagnosis not present

## 2019-01-14 DIAGNOSIS — I469 Cardiac arrest, cause unspecified: Secondary | ICD-10-CM | POA: Diagnosis not present

## 2019-01-14 LAB — POTASSIUM: Potassium: 3.6 mmol/L (ref 3.5–5.1)

## 2019-01-14 NOTE — Progress Notes (Addendum)
Pulmonary Critical Care Medicine [place]   PULMONARY CRITICAL CARE SERVICE  PROGRESS NOTE  Date of Service: 01/14/2019  Melannie Metzner [facility]  [id number]  DOB: March 04, 1951   DOA: 12/30/2018  Referring Physician: Merton Border, MD  HPI: Bianca Willis is a 68 y.o. female seen for follow up of Acute on Chronic Respiratory Failure.  Patient mains extubated on 1 L of oxygen via nasal cannula satting well this time at rest.  Medications: Reviewed on Rounds  Physical Exam:  Vitals: Pulse 92 respirations 18 BP 155/74 O2 sat 99% temp 97.0  Ventilator Settings 1 L nasal cannula  . General: Comfortable at this time . Eyes: Grossly normal lids, irises & conjunctiva . ENT: grossly tongue is normal . Neck: no obvious mass . Cardiovascular: S1 S2 normal no gallop . Respiratory: No rales or rhonchi noted . Abdomen: soft . Skin: no rash seen on limited exam . Musculoskeletal: not rigid . Psychiatric:unable to assess . Neurologic: no seizure no involuntary movements         Lab Data:   Basic Metabolic Panel: Recent Labs  Lab 01/08/19 0520 01/08/19 0736 01/09/19 0530 01/10/19 0716 01/11/19 0521 01/12/19 0737 01/13/19 0723 01/14/19 0605  NA 136  --  136 139 138 137 139  --   K 4.2  --  3.4* 3.5 4.0 3.0* 3.4* 3.6  CL 99  --  97* 97* 97* 97* 98  --   CO2 24  --  30 32 29 30 30   --   GLUCOSE 179*  --  143* 165* 212* 138* 100*  --   BUN 46*  --  52* 44* 41* 44* 30*  --   CREATININE 0.65  --  0.71 0.66 0.73 0.67 0.52  --   CALCIUM 9.7  --  9.8 9.4 9.3 8.7* 8.5*  --   MG  --  1.9  --  2.2 2.4  --  2.3  --   PHOS 3.4  --   --   --  2.9  --  2.3*  --     ABG: No results for input(s): PHART, PCO2ART, PO2ART, HCO3, O2SAT in the last 168 hours.  Liver Function Tests: Recent Labs  Lab 01/08/19 0520 01/08/19 0736 01/11/19 0521  AST QUANTITY NOT SUFFICIENT, UNABLE TO PERFORM TEST 37 18  ALT QUANTITY NOT SUFFICIENT, UNABLE TO PERFORM TEST 68* 50*   ALKPHOS QUANTITY NOT SUFFICIENT, UNABLE TO PERFORM TEST 142* 171*  BILITOT QUANTITY NOT SUFFICIENT, UNABLE TO PERFORM TEST 0.9 0.6  PROT 4.8* 5.4* 5.8*  ALBUMIN 2.4* 2.5* 2.8*   No results for input(s): LIPASE, AMYLASE in the last 168 hours. No results for input(s): AMMONIA in the last 168 hours.  CBC: Recent Labs  Lab 01/08/19 0736 01/10/19 0829 01/13/19 0723  WBC 17.4* 11.8* 8.3  HGB 10.2* 9.4* 9.4*  HCT 32.2* 29.6* 30.2*  MCV 87.3 86.5 88.8  PLT 320 282 281    Cardiac Enzymes: No results for input(s): CKTOTAL, CKMB, CKMBINDEX, TROPONINI in the last 168 hours.  BNP (last 3 results) No results for input(s): BNP in the last 8760 hours.  ProBNP (last 3 results) No results for input(s): PROBNP in the last 8760 hours.  Radiological Exams: No results found.  Assessment/Plan Active Problems:   Acute on chronic respiratory failure with hypoxia (HCC)   Cardiac arrest (HCC)   Ventilator dependent (HCC)   Endotracheally intubated   Obstructive chronic bronchitis without exacerbation (HCC)   Nonspecific abdominal pain  1. Acute on chronic respiratory failure with hypoxia patient will continue to use 1 L of oxygen via nasal cannula.  Continue supportive measures and pulmonary toilet. 2. Cardiac arrest rhythm is stable we will continue with supportive care 3. Ventilator dependence patient has been liberated from the ventilator 4. COPD severe disease we will continue with present management 5. Specific abdominal pain resolved   I have personally seen and evaluated the patient, evaluated laboratory and imaging results, formulated the assessment and plan and placed orders. The Patient requires high complexity decision making for assessment and support.  Case was discussed on Rounds with the Respiratory Therapy Staff  Yevonne PaxSaadat A Ariaunna Longsworth, MD [facility] Pulmonary Critical Care Medicine Sleep Medicine

## 2019-01-15 DIAGNOSIS — R109 Unspecified abdominal pain: Secondary | ICD-10-CM | POA: Diagnosis not present

## 2019-01-15 DIAGNOSIS — I469 Cardiac arrest, cause unspecified: Secondary | ICD-10-CM | POA: Diagnosis not present

## 2019-01-15 DIAGNOSIS — J449 Chronic obstructive pulmonary disease, unspecified: Secondary | ICD-10-CM | POA: Diagnosis not present

## 2019-01-15 DIAGNOSIS — J9621 Acute and chronic respiratory failure with hypoxia: Secondary | ICD-10-CM | POA: Diagnosis not present

## 2019-01-15 LAB — BASIC METABOLIC PANEL
Anion gap: 11 (ref 5–15)
BUN: 28 mg/dL — ABNORMAL HIGH (ref 8–23)
CO2: 32 mmol/L (ref 22–32)
Calcium: 9.2 mg/dL (ref 8.9–10.3)
Chloride: 95 mmol/L — ABNORMAL LOW (ref 98–111)
Creatinine, Ser: 0.49 mg/dL (ref 0.44–1.00)
GFR calc Af Amer: >60 mL/min (ref >60)
GFR calc non Af Amer: >60 mL/min (ref >60)
Glucose, Bld: 126 mg/dL — ABNORMAL HIGH (ref 70–99)
Potassium: 3.4 mmol/L — ABNORMAL LOW (ref 3.5–5.1)
Sodium: 138 mmol/L (ref 135–145)

## 2019-01-15 NOTE — Progress Notes (Addendum)
Pulmonary Critical Care Medicine [place]   PULMONARY CRITICAL CARE SERVICE  PROGRESS NOTE  Date of Service: 01/15/2019  [facility]  [id number]  DOB: 04/25/1951   DOA: 12/30/2018  Referring Physician: Merton Border, MD  HPI: Bianca Willis is a 68 y.o. female seen for follow up of Acute on Chronic Respiratory Failure.  Patient continues on 1 L of oxygen via nasal cannula satting well with no distress.  Medications: Reviewed on Rounds  Physical Exam:  Vitals: Pulse 97 rotations 20 BP 128/63 O2 sat 99% temp 98.3  Ventilator Settings 1 L nasal cannula  . General: Comfortable at this time . Eyes: Grossly normal lids, irises & conjunctiva . ENT: grossly tongue is normal . Neck: no obvious mass . Cardiovascular: S1 S2 normal no gallop . Respiratory: No rales or rhonchi noted . Abdomen: soft . Skin: no rash seen on limited exam . Musculoskeletal: not rigid . Psychiatric:unable to assess . Neurologic: no seizure no involuntary movements         Lab Data:   Basic Metabolic Panel: Recent Labs  Lab 01/10/19 0716 01/11/19 0521 01/12/19 0737 01/13/19 0723 01/14/19 0605 01/15/19 0508  NA 139 138 137 139  --  138  K 3.5 4.0 3.0* 3.4* 3.6 3.4*  CL 97* 97* 97* 98  --  95*  CO2 32 29 30 30   --  32  GLUCOSE 165* 212* 138* 100*  --  126*  BUN 44* 41* 44* 30*  --  28*  CREATININE 0.66 0.73 0.67 0.52  --  0.49  CALCIUM 9.4 9.3 8.7* 8.5*  --  9.2  MG 2.2 2.4  --  2.3  --   --   PHOS  --  2.9  --  2.3*  --   --     ABG: No results for input(s): PHART, PCO2ART, PO2ART, HCO3, O2SAT in the last 168 hours.  Liver Function Tests: Recent Labs  Lab 01/11/19 0521  AST 18  ALT 50*  ALKPHOS 171*  BILITOT 0.6  PROT 5.8*  ALBUMIN 2.8*   No results for input(s): LIPASE, AMYLASE in the last 168 hours. No results for input(s): AMMONIA in the last 168 hours.  CBC: Recent Labs  Lab 01/10/19 0829 01/13/19 0723  WBC 11.8* 8.3  HGB 9.4* 9.4*   HCT 29.6* 30.2*  MCV 86.5 88.8  PLT 282 281    Cardiac Enzymes: No results for input(s): CKTOTAL, CKMB, CKMBINDEX, TROPONINI in the last 168 hours.  BNP (last 3 results) No results for input(s): BNP in the last 8760 hours.  ProBNP (last 3 results) No results for input(s): PROBNP in the last 8760 hours.  Radiological Exams: No results found.  Assessment/Plan Active Problems:   Acute on chronic respiratory failure with hypoxia (HCC)   Cardiac arrest (HCC)   Ventilator dependent (HCC)   Endotracheally intubated   Obstructive chronic bronchitis without exacerbation (HCC)   Nonspecific abdominal pain   1. Acute on chronic respiratory failure with hypoxia patient will continue to use 1 L of oxygen via nasal cannula.  Continue supportive measures and pulmonary toilet. 2. Cardiac arrest rhythm is stable we will continue with supportive care 3. Ventilator dependence patient has been liberated from the ventilator 4. COPD severe disease we will continue with present management 5. Specific abdominal pain resolved   I have personally seen and evaluated the patient, evaluated laboratory and imaging results, formulated the assessment and plan and placed orders. The Patient requires high complexity decision  making for assessment and support.  Case was discussed on Rounds with the Respiratory Therapy Staff  Allyne Gee, MD [facility] Pulmonary Critical Care Medicine Sleep Medicine

## 2019-01-16 DIAGNOSIS — J9621 Acute and chronic respiratory failure with hypoxia: Secondary | ICD-10-CM | POA: Diagnosis not present

## 2019-01-16 DIAGNOSIS — R109 Unspecified abdominal pain: Secondary | ICD-10-CM | POA: Diagnosis not present

## 2019-01-16 DIAGNOSIS — J449 Chronic obstructive pulmonary disease, unspecified: Secondary | ICD-10-CM | POA: Diagnosis not present

## 2019-01-16 DIAGNOSIS — I469 Cardiac arrest, cause unspecified: Secondary | ICD-10-CM | POA: Diagnosis not present

## 2019-01-16 LAB — CBC
HCT: 35.2 % — ABNORMAL LOW (ref 36.0–46.0)
Hemoglobin: 11 g/dL — ABNORMAL LOW (ref 12.0–15.0)
MCH: 28.4 pg (ref 26.0–34.0)
MCHC: 31.3 g/dL (ref 30.0–36.0)
MCV: 90.7 fL (ref 80.0–100.0)
Platelets: 362 10*3/uL (ref 150–400)
RBC: 3.88 MIL/uL (ref 3.87–5.11)
RDW: 17.2 % — ABNORMAL HIGH (ref 11.5–15.5)
WBC: 8.9 10*3/uL (ref 4.0–10.5)
nRBC: 0 % (ref 0.0–0.2)

## 2019-01-16 LAB — BASIC METABOLIC PANEL
Anion gap: 11 (ref 5–15)
BUN: 25 mg/dL — ABNORMAL HIGH (ref 8–23)
CO2: 31 mmol/L (ref 22–32)
Calcium: 9.4 mg/dL (ref 8.9–10.3)
Chloride: 98 mmol/L (ref 98–111)
Creatinine, Ser: 0.51 mg/dL (ref 0.44–1.00)
GFR calc Af Amer: >60 mL/min (ref >60)
GFR calc non Af Amer: >60 mL/min (ref >60)
Glucose, Bld: 109 mg/dL — ABNORMAL HIGH (ref 70–99)
Potassium: 3.7 mmol/L (ref 3.5–5.1)
Sodium: 140 mmol/L (ref 135–145)

## 2019-01-16 LAB — MAGNESIUM: Magnesium: 2.2 mg/dL (ref 1.7–2.4)

## 2019-01-16 LAB — PHOSPHORUS: Phosphorus: 3.4 mg/dL (ref 2.5–4.6)

## 2019-01-16 NOTE — Progress Notes (Addendum)
Pulmonary Critical Care Medicine [place]   PULMONARY CRITICAL CARE SERVICE  PROGRESS NOTE  Date of Service: 01/16/2019  [name] [facility]  [id number]  DOB: 1950/11/21   DOA: 12/30/2018  Referring Physician: Merton Border, MD  HPI: Bianca Willis is a 68 y.o. female seen for follow up of Acute on Chronic Respiratory Failure.  Patient continues on 1 L nasal cannula at this time.  Have been tachypneic intermittently throughout the day.  Medications: Reviewed on Rounds  Physical Exam:  Vitals: Pulse 103 respirations 17 BP 159/87 O2 sat 98% temp 98.6  Ventilator Settings 1 L nasal cannula  . General: Comfortable at this time . Eyes: Grossly normal lids, irises & conjunctiva . ENT: grossly tongue is normal . Neck: no obvious mass . Cardiovascular: S1 S2 normal no gallop . Respiratory: No rales or rhonchi noted . Abdomen: soft . Skin: no rash seen on limited exam . Musculoskeletal: not rigid . Psychiatric:unable to assess . Neurologic: no seizure no involuntary movements         Lab Data:   Basic Metabolic Panel: Recent Labs  Lab 01/10/19 0716 01/11/19 0521 01/12/19 0737 01/13/19 0723 01/14/19 0605 01/15/19 0508 01/16/19 0445  NA 139 138 137 139  --  138 140  K 3.5 4.0 3.0* 3.4* 3.6 3.4* 3.7  CL 97* 97* 97* 98  --  95* 98  CO2 32 29 30 30   --  32 31  GLUCOSE 165* 212* 138* 100*  --  126* 109*  BUN 44* 41* 44* 30*  --  28* 25*  CREATININE 0.66 0.73 0.67 0.52  --  0.49 0.51  CALCIUM 9.4 9.3 8.7* 8.5*  --  9.2 9.4  MG 2.2 2.4  --  2.3  --   --  2.2  PHOS  --  2.9  --  2.3*  --   --  3.4    ABG: No results for input(s): PHART, PCO2ART, PO2ART, HCO3, O2SAT in the last 168 hours.  Liver Function Tests: Recent Labs  Lab 01/11/19 0521  AST 18  ALT 50*  ALKPHOS 171*  BILITOT 0.6  PROT 5.8*  ALBUMIN 2.8*   No results for input(s): LIPASE, AMYLASE in the last 168 hours. No results for input(s): AMMONIA in the last 168  hours.  CBC: Recent Labs  Lab 01/10/19 0829 01/13/19 0723 01/16/19 0445  WBC 11.8* 8.3 8.9  HGB 9.4* 9.4* 11.0*  HCT 29.6* 30.2* 35.2*  MCV 86.5 88.8 90.7  PLT 282 281 362    Cardiac Enzymes: No results for input(s): CKTOTAL, CKMB, CKMBINDEX, TROPONINI in the last 168 hours.  BNP (last 3 results) No results for input(s): BNP in the last 8760 hours.  ProBNP (last 3 results) No results for input(s): PROBNP in the last 8760 hours.  Radiological Exams: No results found.  Assessment/Plan Active Problems:   Acute on chronic respiratory failure with hypoxia (HCC)   Cardiac arrest (HCC)   Ventilator dependent (HCC)   Endotracheally intubated   Obstructive chronic bronchitis without exacerbation (HCC)   Nonspecific abdominal pain   1. Acute on chronic respiratory failure with hypoxiapatient will continue to use 1 L of oxygen via nasal cannula. Continue supportive measures and pulmonary toilet. 2. Cardiac arrest rhythm is stable we will continue with supportive care 3. Ventilator dependence patient has been liberated from the ventilator 4. COPD severe disease we will continue with present management 5. Specific abdominal pain resolved   I have personally seen and evaluated the  patient, evaluated laboratory and imaging results, formulated the assessment and plan and placed orders. The Patient requires high complexity decision making for assessment and support.  Case was discussed on Rounds with the Respiratory Therapy Staff  Allyne Gee, MD [facility] Pulmonary Critical Care Medicine Sleep Medicine

## 2019-01-17 ENCOUNTER — Other Ambulatory Visit (HOSPITAL_COMMUNITY): Payer: Medicare Other

## 2019-01-17 MED ORDER — GENERIC EXTERNAL MEDICATION
Status: DC
Start: ? — End: 2019-01-17

## 2019-01-18 ENCOUNTER — Other Ambulatory Visit (HOSPITAL_COMMUNITY): Payer: Medicare Other

## 2019-01-22 LAB — BLOOD GAS, ARTERIAL
Acid-Base Excess: 12.5 mmol/L — ABNORMAL HIGH (ref 0.0–2.0)
Bicarbonate: 36.5 mmol/L — ABNORMAL HIGH (ref 20.0–28.0)
FIO2: 21
O2 Saturation: 91.7 %
Patient temperature: 98.6
pCO2 arterial: 44.8 mmHg (ref 32.0–48.0)
pH, Arterial: 7.521 — ABNORMAL HIGH (ref 7.350–7.450)
pO2, Arterial: 63 mmHg — ABNORMAL LOW (ref 83.0–108.0)

## 2019-01-22 LAB — CBC
HCT: 33.6 % — ABNORMAL LOW (ref 36.0–46.0)
Hemoglobin: 10.3 g/dL — ABNORMAL LOW (ref 12.0–15.0)
MCH: 28.2 pg (ref 26.0–34.0)
MCHC: 30.7 g/dL (ref 30.0–36.0)
MCV: 92.1 fL (ref 80.0–100.0)
Platelets: 414 10*3/uL — ABNORMAL HIGH (ref 150–400)
RBC: 3.65 MIL/uL — ABNORMAL LOW (ref 3.87–5.11)
RDW: 16.5 % — ABNORMAL HIGH (ref 11.5–15.5)
WBC: 6.6 10*3/uL (ref 4.0–10.5)
nRBC: 0.3 % — ABNORMAL HIGH (ref 0.0–0.2)

## 2019-01-22 LAB — COMPREHENSIVE METABOLIC PANEL
ALT: 34 U/L (ref 0–44)
AST: 22 U/L (ref 15–41)
Albumin: 2.8 g/dL — ABNORMAL LOW (ref 3.5–5.0)
Alkaline Phosphatase: 116 U/L (ref 38–126)
Anion gap: 10 (ref 5–15)
BUN: 23 mg/dL (ref 8–23)
CO2: 33 mmol/L — ABNORMAL HIGH (ref 22–32)
Calcium: 9.4 mg/dL (ref 8.9–10.3)
Chloride: 99 mmol/L (ref 98–111)
Creatinine, Ser: 0.65 mg/dL (ref 0.44–1.00)
GFR calc Af Amer: >60 mL/min (ref >60)
GFR calc non Af Amer: >60 mL/min (ref >60)
Glucose, Bld: 95 mg/dL (ref 70–99)
Potassium: 3.7 mmol/L (ref 3.5–5.1)
Sodium: 142 mmol/L (ref 135–145)
Total Bilirubin: 0.7 mg/dL (ref 0.3–1.2)
Total Protein: 6 g/dL — ABNORMAL LOW (ref 6.5–8.1)

## 2019-01-22 LAB — AMMONIA: Ammonia: 22 umol/L (ref 9–35)

## 2019-01-26 LAB — CBC
HCT: 34.8 % — ABNORMAL LOW (ref 36.0–46.0)
Hemoglobin: 10.5 g/dL — ABNORMAL LOW (ref 12.0–15.0)
MCH: 28.3 pg (ref 26.0–34.0)
MCHC: 30.2 g/dL (ref 30.0–36.0)
MCV: 93.8 fL (ref 80.0–100.0)
Platelets: 369 10*3/uL (ref 150–400)
RBC: 3.71 MIL/uL — ABNORMAL LOW (ref 3.87–5.11)
RDW: 17.2 % — ABNORMAL HIGH (ref 11.5–15.5)
WBC: 6.2 10*3/uL (ref 4.0–10.5)
nRBC: 0.6 % — ABNORMAL HIGH (ref 0.0–0.2)

## 2019-01-26 LAB — SARS CORONAVIRUS 2 BY RT PCR (HOSPITAL ORDER, PERFORMED IN ~~LOC~~ HOSPITAL LAB): SARS Coronavirus 2: NEGATIVE

## 2019-01-26 LAB — MAGNESIUM: Magnesium: 2.4 mg/dL (ref 1.7–2.4)

## 2019-01-26 LAB — BASIC METABOLIC PANEL
Anion gap: 12 (ref 5–15)
BUN: 28 mg/dL — ABNORMAL HIGH (ref 8–23)
CO2: 31 mmol/L (ref 22–32)
Calcium: 9.3 mg/dL (ref 8.9–10.3)
Chloride: 98 mmol/L (ref 98–111)
Creatinine, Ser: 0.74 mg/dL (ref 0.44–1.00)
GFR calc Af Amer: >60 mL/min (ref >60)
GFR calc non Af Amer: >60 mL/min (ref >60)
Glucose, Bld: 106 mg/dL — ABNORMAL HIGH (ref 70–99)
Potassium: 3.5 mmol/L (ref 3.5–5.1)
Sodium: 141 mmol/L (ref 135–145)

## 2019-01-26 LAB — PHOSPHORUS: Phosphorus: 4.2 mg/dL (ref 2.5–4.6)

## 2019-02-05 DEATH — deceased

## 2019-11-07 IMAGING — DX PORTABLE CHEST - 1 VIEW
1 series · 1 of 1 positions shown · non-contrast
Comparison: 01/10/2019

CLINICAL DATA: Tachycardia.

EXAM:
PORTABLE CHEST 1 VIEW

[chest]
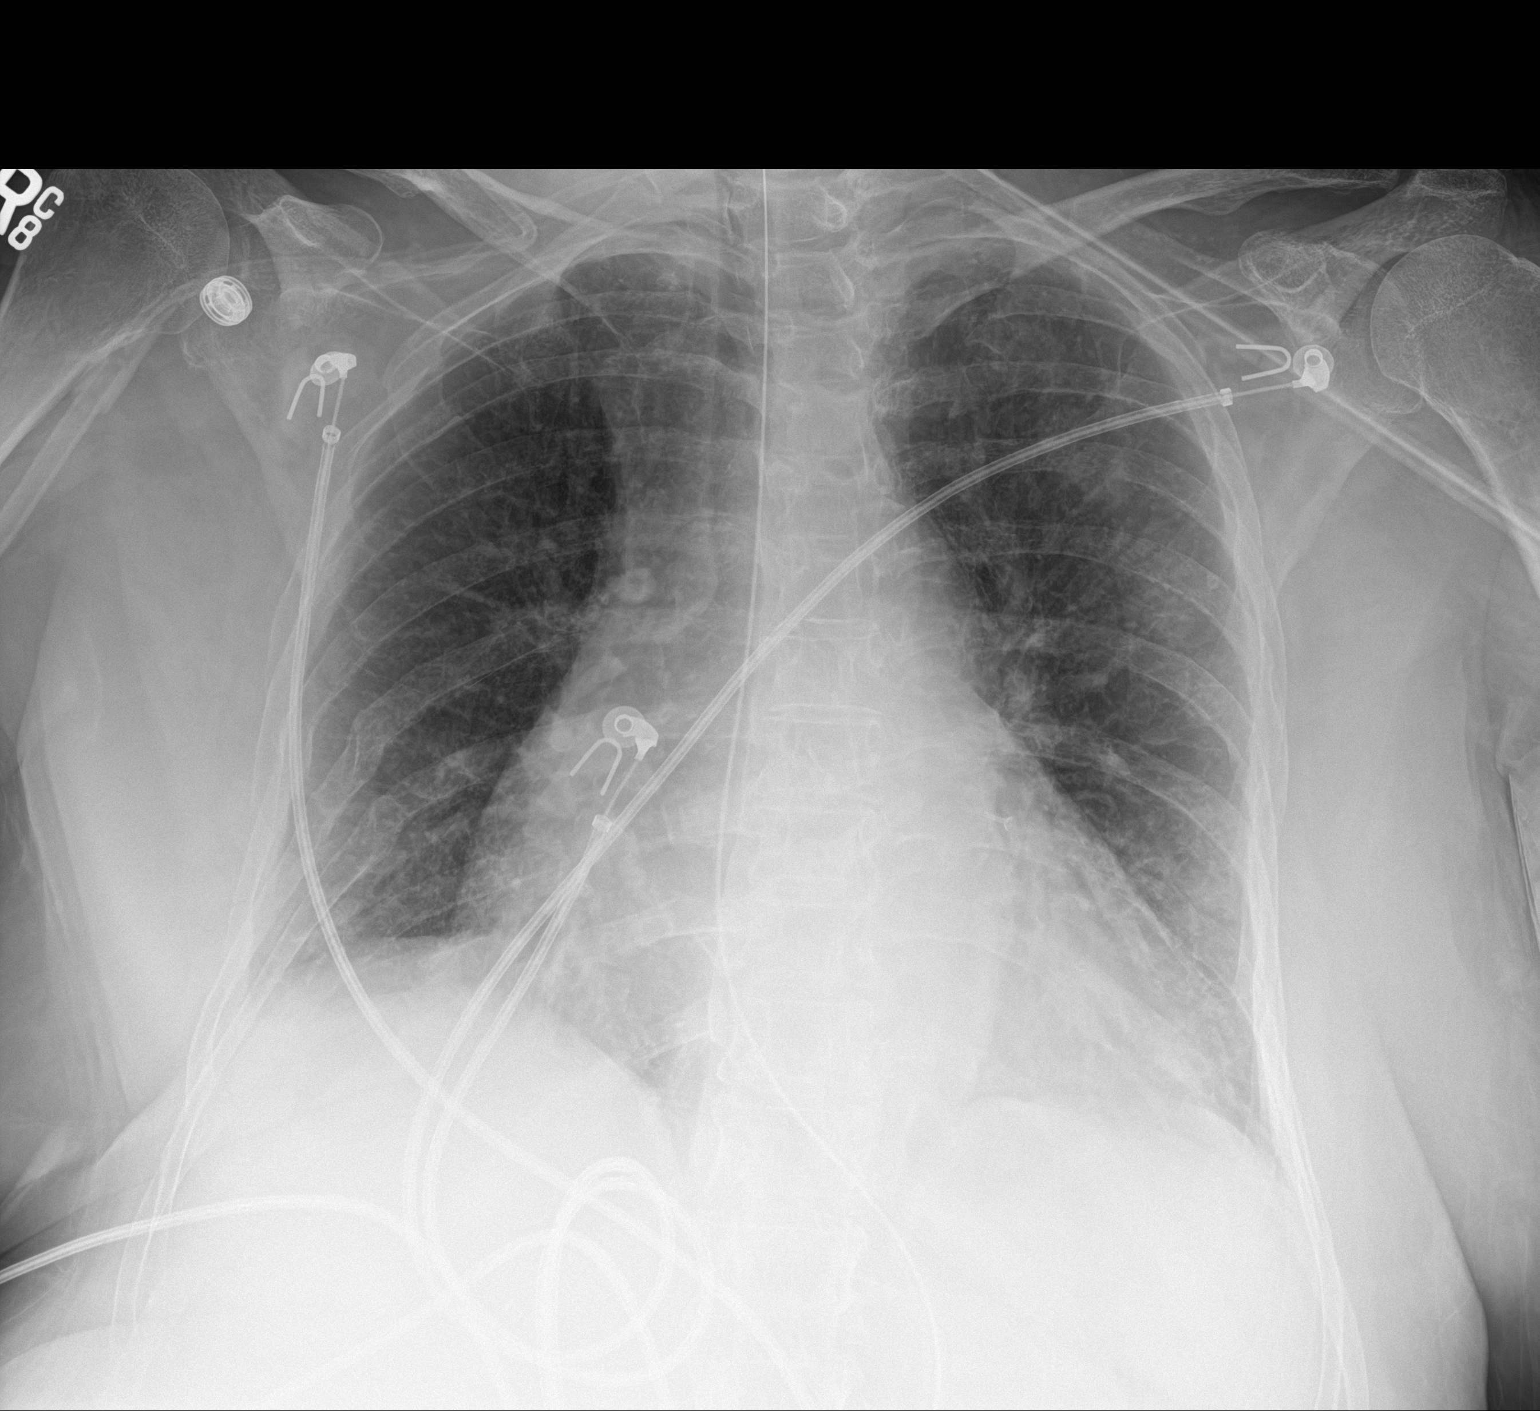

[1 of 1 positions shown; findings below may reference images not displayed]

FINDINGS: Enteric tube courses into the region of the stomach and off the film
as tip is not visualized. Patient slightly rotated to the right.
Lungs are adequately inflated without focal consolidation or
effusion. Mild stable cardiomegaly. Remainder of the exam is
unchanged.
IMPRESSION: No acute cardiopulmonary disease.

Mild stable cardiomegaly.

Enteric tube courses into the stomach and off the film as tip is not
visualized.
